# Patient Record
Sex: Male | Born: 1950 | Race: White | Hispanic: No | Smoking: Former smoker
Health system: Southern US, Community
[De-identification: ages and names within clinical notes are randomized; demographics above are authoritative.]

## PROBLEM LIST (undated history)

## (undated) DIAGNOSIS — I255 Ischemic cardiomyopathy: Secondary | ICD-10-CM

## (undated) DIAGNOSIS — J449 Chronic obstructive pulmonary disease, unspecified: Secondary | ICD-10-CM

## (undated) DIAGNOSIS — I5189 Other ill-defined heart diseases: Secondary | ICD-10-CM

## (undated) DIAGNOSIS — I10 Essential (primary) hypertension: Secondary | ICD-10-CM

## (undated) HISTORY — DX: Other ill-defined heart diseases: I51.89

## (undated) HISTORY — DX: Essential (primary) hypertension: I10

## (undated) HISTORY — PX: NOSE SURGERY: SHX723

## (undated) HISTORY — DX: Chronic obstructive pulmonary disease, unspecified: J44.9

## (undated) HISTORY — DX: Ischemic cardiomyopathy: I25.5

---

## 2020-07-07 ENCOUNTER — Other Ambulatory Visit: Payer: Self-pay

## 2020-07-07 ENCOUNTER — Ambulatory Visit (INDEPENDENT_AMBULATORY_CARE_PROVIDER_SITE_OTHER): Payer: Medicare Other

## 2020-07-07 ENCOUNTER — Ambulatory Visit
Admission: RE | Admit: 2020-07-07 | Discharge: 2020-07-07 | Disposition: A | Discharge status: Home / Self Care | Payer: Medicare Other | Source: Ambulatory Visit | Attending: Family Medicine | Admitting: Family Medicine

## 2020-07-07 VITALS — BP 164/98 | HR 96 | Temp 98.1°F | Resp 18

## 2020-07-07 DIAGNOSIS — F17200 Nicotine dependence, unspecified, uncomplicated: Secondary | ICD-10-CM | POA: Diagnosis present

## 2020-07-07 DIAGNOSIS — R0602 Shortness of breath: Secondary | ICD-10-CM

## 2020-07-07 DIAGNOSIS — I517 Cardiomegaly: Secondary | ICD-10-CM

## 2020-07-07 DIAGNOSIS — I10 Essential (primary) hypertension: Secondary | ICD-10-CM | POA: Diagnosis present

## 2020-07-07 DIAGNOSIS — M549 Dorsalgia, unspecified: Secondary | ICD-10-CM

## 2020-07-07 LAB — POCT URINALYSIS DIP (MANUAL ENTRY)
Bilirubin, UA: NEGATIVE
Blood, UA: NEGATIVE
Glucose, UA: NEGATIVE mg/dL
Ketones, POC UA: NEGATIVE mg/dL
Leukocytes, UA: NEGATIVE
Nitrite, UA: NEGATIVE
Protein Ur, POC: 30 mg/dL — AB
Spec Grav, UA: >=1.03 — AB (ref 1.010–1.025)
Urobilinogen, UA: 0.2 E.U./dL
pH, UA: 6 (ref 5.0–8.0)

## 2020-07-07 MED ORDER — CARVEDILOL 6.25 MG PO TABS: 6.2500 mg | ORAL_TABLET | Freq: Two times a day (BID) | ORAL | 0 refills | Status: DC

## 2020-07-07 NOTE — Discharge Instructions (Addendum)
You have been referred to heart care cardiology in [facility] for further work-up and evaluation of your symptoms today.  I am starting on carvedilol 6.25 mg twice daily to decrease your blood pressure which will help your heart pump more efficiently.  I have also prescribed you an albuterol inhaler to help with shortness of breath which is likely related to chronic bronchitis and uncontrolled blood pressure.  If you develop any worsening of symptoms prior to your visit with the cardiologist go immediately to the nearest emergency department. On your chest x-ray today, it was noted the presence of a right lung nodule this will require further evaluation and work-up.  You need to get established with a primary care doctor for further work-up and diagnostic testing of this finding.

## 2020-07-07 NOTE — ED Triage Notes (Signed)
Patient c/o SOB and fatigue x 2.5 months ago.   Patient endorses pain in both lower limbs upon exertion.   Patient endorses increase Sob upon exertion.   Patient endorses " I felt like I was drowning at night due to congestion, I started taking mucinex and it's helped".  Patient denies any previous health history.   Patient has smoked since age 70, patient smokes a 1/2 pack a day.   Patient denies taking any medications at this time.

## 2020-07-07 NOTE — ED Provider Notes (Signed)
[facility]    CSN: [id number] Arrival date & time: 07/07/20  1142      History   Chief Complaint No chief complaint on file.   HPI Luis Madden is a 70 y.o. male.   HPI Patient presents for evaluation worsening shortness of breath, bilateral leg pain, and back pain. Patient is a daily smoker. He has been without routine medical care and report no known chronic conditions.  He endorses recent weight gain in abdominal region over the last 5 months. Shortness of breath has gradually worsened to include at rest.  Blood pressures elevated x2 readings. He denies any chest pain.  He has a chronic cough which he reports is been present for several years.  Currently does not take any medications.  No past medical history on file.  There are no problems to display for this patient. =   Home Medications    Prior to Admission medications   Not on File    Family History No family history on file.  Social History     Allergies   Patient has no allergy information on record.   Review of Systems Review of Systems Pertinent negatives listed in HPI Physical Exam Triage Vital Signs ED Triage Vitals [07/07/20 1158]  Enc Vitals Group     BP (!) 164/74     Pulse Rate 96     Resp 18     Temp 98.1 F (36.7 C)     Temp Source Oral     SpO2 96 %     Weight      Height      Head Circumference      Peak Flow      Pain Score      Pain Loc      Pain Edu?      Excl. in GC?    No data found.  Updated Vital Signs BP (!) 164/74 (BP Location: Left Arm)   Pulse 96   Temp 98.1 F (36.7 C) (Oral)   Resp 18   SpO2 96%   Visual Acuity Right Eye Distance:   Left Eye Distance:   Bilateral Distance:    Right Eye Near:   Left Eye Near:    Bilateral Near:     Physical Exam Constitutional:      General: He is not in acute distress.    Appearance: He is not toxic-appearing.  Cardiovascular:     Rate and Rhythm: Normal rate.     Heart sounds: Murmur heard.    Diastolic murmur is present with a grade of 2/4.     Comments: Abdomen distention mildly noted during exam  Pulmonary:     Breath sounds: Examination of the right-upper field reveals rhonchi. Examination of the left-upper field reveals rhonchi. Decreased breath sounds and rhonchi present.  Musculoskeletal:        General: Normal range of motion.     Right lower leg: No edema.     Left lower leg: No edema.  Skin:    General: Skin is warm.     Capillary Refill: Capillary refill takes less than 2 seconds.  Neurological:     General: No focal deficit present.     Mental Status: He is alert and oriented to person, place, and time.     Cranial Nerves: No cranial nerve deficit.  Psychiatric:        Mood and Affect: Mood is anxious.      UC Treatments / Results  Labs (  all labs ordered are listed, but only abnormal results are displayed) Labs Reviewed - No data to display  EKG Rate 77, LVH with left axis deviation repolarization abnormality, negative for ischemic changes   Radiology No results found.  Procedures Procedures (including critical care time)  Medications Ordered in UC Medications - No data to display  Initial Impression / Assessment and Plan / UC Course  I have reviewed the triage vital signs and the nursing notes.  Pertinent labs & imaging results that were available during my care of the patient were reviewed by me and considered in my medical decision making (see chart for details).     Patient presents today with several months of worsening shortness of breath. Hypertension x multiple measurement on arrival. Audible murmur ICS # 3 AorticValve present on exam. No healthcare evaluation in more than 7 years. Concern for untreated hypertension progression to heart failure based on exam findings here in clinic. ECG LVH with repolarization abnormality, emergent referral placed to follow-up with cardiology. Chest x-ray significant for 6 mm right lung nodule possible  granuloma discussed with patient and his daughter the need to establish with a PCP.  Final Clinical Impressions(s) / UC Diagnoses   Final diagnoses:  Shortness of breath  Elevated blood pressure reading in office with diagnosis of hypertension  Current smoker  LVH (left ventricular hypertrophy), ecg     Discharge Instructions     You have been referred to heart care cardiology in [facility] for further work-up and evaluation of your symptoms today.  I am starting on carvedilol 6.25 mg twice daily to decrease your blood pressure which will help your heart pump more efficiently.  I have also prescribed you an albuterol inhaler to help with shortness of breath which is likely related to chronic bronchitis and uncontrolled blood pressure.  If you develop any worsening of symptoms prior to your visit with the cardiologist go immediately to the nearest emergency department. On your chest x-ray today, it was noted the presence of a right lung nodule this will require further evaluation and work-up.  You need to get established with a primary care doctor for further work-up and diagnostic testing of this finding.    ED Prescriptions    Medication Sig Dispense Auth. Provider   carvedilol (COREG) 6.25 MG tablet Take 1 tablet (6.25 mg total) by mouth 2 (two) times daily with a meal. 30 tablet Bing Neighbors, FNP     PDMP not reviewed this encounter.   Bing Neighbors, FNP 07/07/20 [phone]

## 2020-07-08 LAB — BASIC METABOLIC PANEL
BUN/Creatinine Ratio: 19 (ref 10–24)
BUN: 22 mg/dL (ref 8–27)
CO2: 16 mmol/L — ABNORMAL LOW (ref 20–29)
Calcium: 9.7 mg/dL (ref 8.6–10.2)
Chloride: 103 mmol/L (ref 96–106)
Creatinine, Ser: 1.18 mg/dL (ref 0.76–1.27)
Glucose: 98 mg/dL (ref 65–99)
Potassium: 4.9 mmol/L (ref 3.5–5.2)
Sodium: 139 mmol/L (ref 134–144)
eGFR: 66 mL/min/{1.73_m2} (ref >59)

## 2020-07-08 LAB — CBC
Hematocrit: 47.1 % (ref 37.5–51.0)
Hemoglobin: 16 g/dL (ref 13.0–17.7)
MCH: 32.8 pg (ref 26.6–33.0)
MCHC: 34 g/dL (ref 31.5–35.7)
MCV: 97 fL (ref 79–97)
Platelets: 211 10*3/uL (ref 150–450)
RBC: 4.88 x10E6/uL (ref 4.14–5.80)
RDW: 13.1 % (ref 11.6–15.4)
WBC: 9.5 10*3/uL (ref 3.4–10.8)

## 2020-07-13 ENCOUNTER — Encounter: Payer: Self-pay | Admitting: Cardiology

## 2020-07-13 ENCOUNTER — Ambulatory Visit (INDEPENDENT_AMBULATORY_CARE_PROVIDER_SITE_OTHER): Payer: Medicare Other | Admitting: Cardiology

## 2020-07-13 ENCOUNTER — Other Ambulatory Visit: Payer: Self-pay

## 2020-07-13 VITALS — BP 178/88 | HR 66 | Ht 71.5 in | Wt 213.0 lb

## 2020-07-13 DIAGNOSIS — I10 Essential (primary) hypertension: Secondary | ICD-10-CM

## 2020-07-13 DIAGNOSIS — I209 Angina pectoris, unspecified: Secondary | ICD-10-CM | POA: Diagnosis not present

## 2020-07-13 DIAGNOSIS — R072 Precordial pain: Secondary | ICD-10-CM | POA: Diagnosis not present

## 2020-07-13 DIAGNOSIS — R0600 Dyspnea, unspecified: Secondary | ICD-10-CM

## 2020-07-13 DIAGNOSIS — R0609 Other forms of dyspnea: Secondary | ICD-10-CM

## 2020-07-13 MED ORDER — METOPROLOL TARTRATE 100 MG PO TABS: 100.0000 mg | ORAL_TABLET | Freq: Once | ORAL | 0 refills | Status: DC

## 2020-07-13 MED ORDER — IRBESARTAN 75 MG PO TABS: 75.0000 mg | ORAL_TABLET | Freq: Every day | ORAL | 3 refills | Status: DC

## 2020-07-13 NOTE — Patient Instructions (Signed)
Medication Instructions:  Your physician has recommended you make the following change in your medication:   1.  START taking Irbesartan 75 MG once daily.  *If you need a refill on your cardiac medications before your next appointment, please call your pharmacy*   Lab Work:  BMP, Lipid panel drawn in office today  If you have labs (blood work) drawn today and your tests are completely normal, you will receive your results only by: Marland Kitchen MyChart Message (if you have MyChart) OR . A paper copy in the mail If you have any lab test that is abnormal or we need to change your treatment, we will call you to review the results.   Testing/Procedures:  1.  Your physician has requested that you have an echocardiogram. Echocardiography is a painless test that uses sound waves to create images of your heart. It provides your doctor with information about the size and shape of your heart and how well your heart's chambers and valves are working. This procedure takes approximately one hour. There are no restrictions for this procedure.  2.  Your physician has requested that you have coronary cardiac CT. Cardiac computed tomography (CT) is a painless test that uses an x-ray machine to take clear, detailed pictures of your heart.  Your cardiac CT will be scheduled at:   [facility] [address] [place], [place] [address] [phone]   Please arrive 15 mins early for check-in and test prep.   Please follow these instructions carefully (unless otherwise directed):    On the Night Before the Test: . Be sure to Drink plenty of water. . Do not consume any caffeinated/decaffeinated beverages or chocolate 12 hours prior to your test. . Do not take any antihistamines 12 hours prior to your test.    On the Day of the Test: . Drink plenty of water until 1 hour prior to the test. . Do not eat any food 4 hours prior to the test. . You may take your  regular medications prior to the test.  . Take metoprolol (Lopressor) two hours prior to test. (this was sent to your pharmacy)        After the Test: . Drink plenty of water. . After receiving IV contrast, you may experience a mild flushed feeling. This is normal. . On occasion, you may experience a mild rash up to 24 hours after the test. This is not dangerous. If this occurs, you can take Benadryl 25 mg and increase your fluid intake. . If you experience trouble breathing, this can be serious. If it is severe call 911 IMMEDIATELY. If it is mild, please call our office.    Once we have confirmed authorization from your insurance company, we will call you to set up a date and time for your test. Based on how quickly your insurance processes prior authorizations requests, please allow up to 4 weeks to be contacted for scheduling your Cardiac CT appointment. Be advised that routine Cardiac CT appointments could be scheduled as many as 8 weeks after your provider has ordered it.  For non-scheduling related questions, please contact the cardiac imaging nurse navigator should you have any questions/concerns: Rockwell Alexandria, Cardiac Imaging Nurse Navigator Larey Brick, Cardiac Imaging Nurse Navigator [facility] Direct Office Dial: [phone]   For scheduling needs, including cancellations and rescheduling, please call [place], [phone].    Follow-Up: At [facility], you and your health needs are our priority.  As  part of our continuing mission to provide you with exceptional heart care, we have created designated Provider Care Teams.  These Care Teams include your primary Cardiologist (physician) and Advanced Practice Providers (APPs -  Physician Assistants and Nurse Practitioners) who all work together to provide you with the care you need, when you need it.  We recommend signing up for the patient portal called "MyChart".  Sign up information is  provided on this After Visit Summary.  MyChart is used to connect with patients for Virtual Visits (Telemedicine).  Patients are able to view lab/test results, encounter notes, upcoming appointments, etc.  Non-urgent messages can be sent to your provider as well.   To learn more about what you can do with MyChart, go to [email or web address].    Your next appointment:   6 week(s)  The format for your next appointment:   In Person  Provider:   Debbe Odea, MD   Other Instructions

## 2020-07-13 NOTE — Progress Notes (Addendum)
Cardiology Office Note:    Date:  07/13/2020   ID:  Luis Madden, DOB [birth year], MRN [id number]  PCP:  [facility]  Cardiologist:  No primary care provider on file.  Advanced Practice Provider:  No care team member to display Electrophysiologist:  None       Referring MD: Luis Neighbors, FNP   Chief Complaint  Patient presents with  . New Patient (Initial Visit)    Referred by Luis Madden for SOB - chest pain. Meds reviewed verbally with patient.     History of Present Illness:    Luis Madden is a 70 y.o. male with a hx of hypertension, [name] x50+ years who presents due to chest pain and shortness of breath.  Patient has symptoms of shortness of breath with exertion ongoing for the past year and a half.  Symptoms seem to have worsened.  He thought this was initially due to the Covid vaccine which he will obtain back in June 2020.  He is visiting family in the area, currently lives in [place].  Has not seen a physician for couple of years now.  Due to worsening symptoms, he presented to an urgent care, he was diagnosed with hypertension, started on Coreg 6.25 mg twice daily.  He recently quit smoking within the past week.  Denies chest pain, palpitations, endorses orthopnea.   Past Medical History:  Diagnosis Date  . Hypertension     History reviewed. No pertinent surgical history.  Current Medications: Current Meds  Medication Sig  . carvedilol (COREG) 6.25 MG tablet Take 1 tablet (6.25 mg total) by mouth 2 (two) times daily with a meal.  . irbesartan (AVAPRO) 75 MG tablet Take 1 tablet (75 mg total) by mouth daily.  . metoprolol tartrate (LOPRESSOR) 100 MG tablet Take 1 tablet (100 mg total) by mouth once for 1 dose. Take 2 hours prior to your CT scan.     Allergies:   Patient has no allergy information on record.   Social History   Socioeconomic History  . Marital status: Single    Spouse name: Not on file  .  Number of children: Not on file  . Years of education: Not on file  . Highest education level: Not on file  Occupational History  . Not on file  Tobacco Use  . Smoking status: [name]    Packs/day: 0.50    Years: 57.00    Pack years: 28.50    Types: Cigarettes  . Smokeless tobacco: Never Used  . Tobacco comment: Stopped smoking Thursday - 07/08/2020  Substance and Sexual Activity  . Alcohol use: Yes    Comment: " 3-4 beers a month"  . Drug use: Never  . Sexual activity: Not on file  Other Topics Concern  . Not on file  Social History Narrative  . Not on file   Social Determinants of Health   Financial Resource Strain: Not on file  Food Insecurity: Not on file  Transportation Needs: Not on file  Physical Activity: Not on file  Stress: Not on file  Social Connections: Not on file     Family History: The patient's family history is not on file.  ROS:   Please see the history of present illness.     All other systems reviewed and are negative.  EKGs/Labs/Other Studies Reviewed:    The following studies were reviewed today:   EKG:  EKG is  ordered today.  The ekg  ordered today demonstrates normal sinus rhythm, LVH by voltage criteria.  Recent Labs: 07/07/2020: BUN 22; Creatinine, Ser 1.18; Hemoglobin 16.0; Platelets 211; Potassium 4.9; Sodium 139  Recent Lipid Panel No results found for: CHOL, TRIG, HDL, CHOLHDL, VLDL, LDLCALC, LDLDIRECT   Risk Assessment/Calculations:      Physical Exam:    VS:  BP (!) 178/88 (BP Location: Left Arm, Patient Position: Sitting, Cuff Size: Normal)   Pulse 66   Ht 5' 11.5" (1.816 m)   Wt 213 lb (96.6 kg)   SpO2 97%   BMI 29.29 kg/m     Wt Readings from Last 3 Encounters:  07/13/20 213 lb (96.6 kg)     GEN:  Well nourished, well developed in no acute distress HEENT: Normal NECK: No JVD; No carotid bruits LYMPHATICS: No lymphadenopathy CARDIAC: RRR, faint systolic murmur, RESPIRATORY:  Clear to auscultation  without rales, wheezing or rhonchi  ABDOMEN: Soft, non-tender, non-distended MUSCULOSKELETAL:  No edema; No deformity  SKIN: Warm and dry NEUROLOGIC:  Alert and oriented x 3 PSYCHIATRIC:  Normal affect   ASSESSMENT:    1. Dyspnea on exertion   2. Primary hypertension   3. Precordial pain   4. Anginal equivalent (HCC)    PLAN:    In order of problems listed above:  1. Dyspnea on exertion, risk factors of hypertension, [name].  This could be an anginal equivalent.  Also nonspecific chest discomfort. obtain echocardiogram, coronary CTA to evaluate presence of CAD.  If cardiac work-up is normal, recommend pulmonary eval for possible COPD due to long history of smoking.  Obtain fasting lipid profile. 2. Hypertension, BP still elevated.  Start irbesartan 75 mg daily, continue Coreg as prescribed.  Follow-up after echo and coronary CTA.  We will consider Lexiscan Myoview if coronary CTA is not approved.   Shared Decision Making/Informed Consent The risks [chest pain, shortness of breath, cardiac arrhythmias, dizziness, blood pressure fluctuations, myocardial infarction, stroke/transient ischemic attack, nausea, vomiting, allergic reaction, radiation exposure, metallic taste sensation and life-threatening complications (estimated to be 1 in 10,000)], benefits (risk stratification, diagnosing coronary artery disease, treatment guidance) and alternatives of a nuclear stress test were discussed in detail with Luis Madden and he agrees to proceed.      Medication Adjustments/Labs and Tests Ordered: Current medicines are reviewed at length with the patient today.  Concerns regarding medicines are outlined above.  Orders Placed This Encounter  Procedures  . Basic metabolic panel  . Lipid panel  . EKG 12-Lead  . ECHOCARDIOGRAM COMPLETE   Meds ordered this encounter  Medications  . irbesartan (AVAPRO) 75 MG tablet    Sig: Take 1 tablet (75 mg total) by mouth daily.    Dispense:   30 tablet    Refill:  3  . metoprolol tartrate (LOPRESSOR) 100 MG tablet    Sig: Take 1 tablet (100 mg total) by mouth once for 1 dose. Take 2 hours prior to your CT scan.    Dispense:  1 tablet    Refill:  0    Patient Instructions  Medication Instructions:  Your physician has recommended you make the following change in your medication:   1.  START taking Irbesartan 75 MG once daily.  *If you need a refill on your cardiac medications before your next appointment, please call your pharmacy*   Lab Work:  BMP, Lipid panel drawn in office today  If you have labs (blood work) drawn today and your tests are completely normal, you will receive your results  only by: Marland Kitchen MyChart Message (if you have MyChart) OR . A paper copy in the mail If you have any lab test that is abnormal or we need to change your treatment, we will call you to review the results.   Testing/Procedures:  1.  Your physician has requested that you have an echocardiogram. Echocardiography is a painless test that uses sound waves to create images of your heart. It provides your doctor with information about the size and shape of your heart and how well your heart's chambers and valves are working. This procedure takes approximately one hour. There are no restrictions for this procedure.  2.  Your physician has requested that you have coronary cardiac CT. Cardiac computed tomography (CT) is a painless test that uses an x-ray machine to take clear, detailed pictures of your heart.  Your cardiac CT will be scheduled at:   [facility] [address] [place], [place] [address] [phone]   Please arrive 15 mins early for check-in and test prep.   Please follow these instructions carefully (unless otherwise directed):    On the Night Before the Test: . Be sure to Drink plenty of water. . Do not consume any caffeinated/decaffeinated beverages or chocolate 12 hours  prior to your test. . Do not take any antihistamines 12 hours prior to your test.    On the Day of the Test: . Drink plenty of water until 1 hour prior to the test. . Do not eat any food 4 hours prior to the test. . You may take your regular medications prior to the test.  . Take metoprolol (Lopressor) two hours prior to test. (this was sent to your pharmacy)        After the Test: . Drink plenty of water. . After receiving IV contrast, you may experience a mild flushed feeling. This is normal. . On occasion, you may experience a mild rash up to 24 hours after the test. This is not dangerous. If this occurs, you can take Benadryl 25 mg and increase your fluid intake. . If you experience trouble breathing, this can be serious. If it is severe call 911 IMMEDIATELY. If it is mild, please call our office.    Once we have confirmed authorization from your insurance company, we will call you to set up a date and time for your test. Based on how quickly your insurance processes prior authorizations requests, please allow up to 4 weeks to be contacted for scheduling your Cardiac CT appointment. Be advised that routine Cardiac CT appointments could be scheduled as many as 8 weeks after your provider has ordered it.  For non-scheduling related questions, please contact the cardiac imaging nurse navigator should you have any questions/concerns: Rockwell Alexandria, Cardiac Imaging Nurse Navigator Larey Brick, Cardiac Imaging Nurse Navigator [facility] Direct Office Dial: [phone]   For scheduling needs, including cancellations and rescheduling, please call [place], [phone].    Follow-Up: At [facility], you and your health needs are our priority.  As part of our continuing mission to provide you with exceptional heart care, we have created designated Provider Care Teams.  These Care Teams include your primary Cardiologist (physician) and Advanced Practice  Providers (APPs -  Physician Assistants and Nurse Practitioners) who all work together to provide you with the care you need, when you need it.  We recommend signing up for the patient portal called "MyChart".  Sign up information is provided on this  After Visit Summary.  MyChart is used to connect with patients for Virtual Visits (Telemedicine).  Patients are able to view lab/test results, encounter notes, upcoming appointments, etc.  Non-urgent messages can be sent to your provider as well.   To learn more about what you can do with MyChart, go to [email or web address].    Your next appointment:   6 week(s)  The format for your next appointment:   In Person  Provider:   Debbe Odea, MD   Other Instructions      Signed, Debbe Odea, MD  07/13/2020 10:42 AM    [facility]

## 2020-07-14 LAB — LIPID PANEL
Chol/HDL Ratio: 8 ratio — ABNORMAL HIGH (ref 0.0–5.0)
Cholesterol, Total: 199 mg/dL (ref 100–199)
HDL: 25 mg/dL — ABNORMAL LOW (ref >39)
LDL Chol Calc (NIH): 149 mg/dL — ABNORMAL HIGH (ref 0–99)
Triglycerides: 134 mg/dL (ref 0–149)
VLDL Cholesterol Cal: 25 mg/dL (ref 5–40)

## 2020-07-14 LAB — BASIC METABOLIC PANEL
BUN/Creatinine Ratio: 14 (ref 10–24)
BUN: 18 mg/dL (ref 8–27)
CO2: 21 mmol/L (ref 20–29)
Calcium: 9.7 mg/dL (ref 8.6–10.2)
Chloride: 105 mmol/L (ref 96–106)
Creatinine, Ser: 1.3 mg/dL — ABNORMAL HIGH (ref 0.76–1.27)
Glucose: 110 mg/dL — ABNORMAL HIGH (ref 65–99)
Potassium: 4.8 mmol/L (ref 3.5–5.2)
Sodium: 141 mmol/L (ref 134–144)
eGFR: 59 mL/min/{1.73_m2} — ABNORMAL LOW (ref >59)

## 2020-07-15 ENCOUNTER — Telehealth: Payer: Self-pay

## 2020-07-15 ENCOUNTER — Other Ambulatory Visit: Payer: Self-pay

## 2020-07-15 DIAGNOSIS — I209 Angina pectoris, unspecified: Secondary | ICD-10-CM

## 2020-07-15 NOTE — Telephone Encounter (Signed)
Spoke with patient and informed him of the result note. Patient verbalized understanding and agreed with plan.

## 2020-07-15 NOTE — Telephone Encounter (Signed)
Called patient and left a VM requesting a call back so I can give him the following lab result note:  Debbe Odea, MD  Gibson Ramp, RN Okay for coronary CTA. Will recommend hydration/IV fluids after study.   Also sent a message to Micael Hampshire at [facility] with the IV fluid request as instructed by Dr. Azucena Cecil.

## 2020-07-15 NOTE — Telephone Encounter (Signed)
Patient is returning your call.  

## 2020-07-22 ENCOUNTER — Other Ambulatory Visit: Payer: Self-pay

## 2020-07-22 DIAGNOSIS — I209 Angina pectoris, unspecified: Secondary | ICD-10-CM

## 2020-07-22 DIAGNOSIS — R072 Precordial pain: Secondary | ICD-10-CM

## 2020-07-27 ENCOUNTER — Telehealth (HOSPITAL_COMMUNITY): Payer: Self-pay | Admitting: *Deleted

## 2020-07-27 NOTE — Telephone Encounter (Signed)
Reaching out to patient to offer assistance regarding upcoming cardiac imaging study; pt verbalizes understanding of appt date/time, parking situation and where to check in, pre-test NPO status and medications ordered, and verified current allergies; name and call back number provided for further questions should they arise  Jameelah Watts RN Navigator Cardiac Imaging [facility] [phone] office [phone] cell  Pt to take 100mg metoprolol tartrate 2 hours prior to cardiac CT scan. 

## 2020-07-29 ENCOUNTER — Ambulatory Visit
Admission: RE | Admit: 2020-07-29 | Discharge: 2020-07-29 | Disposition: A | Discharge status: Home / Self Care | Payer: Medicare Other | Source: Ambulatory Visit | Attending: Cardiology | Admitting: Cardiology

## 2020-07-29 ENCOUNTER — Other Ambulatory Visit: Payer: Self-pay

## 2020-07-29 ENCOUNTER — Other Ambulatory Visit: Payer: Self-pay | Admitting: Cardiology

## 2020-07-29 DIAGNOSIS — R072 Precordial pain: Secondary | ICD-10-CM | POA: Diagnosis present

## 2020-07-29 DIAGNOSIS — I25119 Atherosclerotic heart disease of native coronary artery with unspecified angina pectoris: Secondary | ICD-10-CM | POA: Diagnosis not present

## 2020-07-29 DIAGNOSIS — I209 Angina pectoris, unspecified: Secondary | ICD-10-CM | POA: Insufficient documentation

## 2020-07-29 DIAGNOSIS — R931 Abnormal findings on diagnostic imaging of heart and coronary circulation: Secondary | ICD-10-CM

## 2020-07-29 MED ORDER — IOHEXOL 350 MG/ML SOLN
90.0000 mL | Freq: Once | INTRAVENOUS | Status: AC | PRN
  Administered 2020-07-29: 90 mL via INTRAVENOUS

## 2020-07-29 MED ORDER — NITROGLYCERIN 0.4 MG SL SUBL
0.8000 mg | SUBLINGUAL_TABLET | Freq: Once | SUBLINGUAL | Status: AC
  Administered 2020-07-29: 0.8 mg via SUBLINGUAL

## 2020-07-29 NOTE — Progress Notes (Signed)
Patient tolerated procedure well. Ambulate w/o difficulty. Sitting in chair drinking water and coffee provided. Encouraged to drink extra water today and reasoning explained. Verbalized understanding. All questions answered. ABC intact. No further needs. Discharge from procedure area w/o issues.  

## 2020-07-30 ENCOUNTER — Telehealth: Payer: Self-pay

## 2020-07-30 NOTE — Telephone Encounter (Signed)
Called patient and gave him his CTA result note. Scheduled patient to come in on 08/05/20. He was very grateful for the call.

## 2020-07-30 NOTE — Telephone Encounter (Signed)
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  Please have patient follow-up with myself for an earlier appointment if available.  We will need to discuss viability study (cardiac MRI) prior to possible left heart cath.  Thank you

## 2020-08-05 ENCOUNTER — Ambulatory Visit (INDEPENDENT_AMBULATORY_CARE_PROVIDER_SITE_OTHER): Payer: Medicare Other | Admitting: Cardiology

## 2020-08-05 ENCOUNTER — Telehealth: Payer: Self-pay | Admitting: Cardiology

## 2020-08-05 ENCOUNTER — Encounter: Payer: Self-pay | Admitting: Cardiology

## 2020-08-05 ENCOUNTER — Other Ambulatory Visit: Payer: Self-pay

## 2020-08-05 VITALS — BP 170/62 | HR 61 | Ht 71.0 in | Wt 217.0 lb

## 2020-08-05 DIAGNOSIS — I10 Essential (primary) hypertension: Secondary | ICD-10-CM | POA: Diagnosis not present

## 2020-08-05 DIAGNOSIS — R0600 Dyspnea, unspecified: Secondary | ICD-10-CM

## 2020-08-05 DIAGNOSIS — I739 Peripheral vascular disease, unspecified: Secondary | ICD-10-CM

## 2020-08-05 DIAGNOSIS — I2510 Atherosclerotic heart disease of native coronary artery without angina pectoris: Secondary | ICD-10-CM

## 2020-08-05 DIAGNOSIS — J439 Emphysema, unspecified: Secondary | ICD-10-CM

## 2020-08-05 DIAGNOSIS — R0609 Other forms of dyspnea: Secondary | ICD-10-CM

## 2020-08-05 MED ORDER — ATORVASTATIN CALCIUM 40 MG PO TABS: 40.0000 mg | ORAL_TABLET | Freq: Every day | ORAL | 5 refills | Status: DC

## 2020-08-05 MED ORDER — IRBESARTAN 150 MG PO TABS: 150.0000 mg | ORAL_TABLET | Freq: Every day | ORAL | 5 refills | Status: DC

## 2020-08-05 MED ORDER — ASPIRIN EC 81 MG PO TBEC: 81.0000 mg | DELAYED_RELEASE_TABLET | Freq: Every day | ORAL | 3 refills | Status: AC

## 2020-08-05 MED ORDER — CARVEDILOL 6.25 MG PO TABS: 6.2500 mg | ORAL_TABLET | Freq: Two times a day (BID) | ORAL | 0 refills | Status: DC

## 2020-08-05 MED ORDER — CILOSTAZOL 100 MG PO TABS: 100.0000 mg | ORAL_TABLET | Freq: Two times a day (BID) | ORAL | 5 refills | Status: DC

## 2020-08-05 NOTE — Patient Instructions (Addendum)
Medication Instructions:   1.  INCREASE Irbesartan to 150 MG once a day.  2.  START taking Cilostazol 100 MG twice a day.  3.  START taking Lipitor 40 MG once a day.  4.  START taking Aspirin 81 MG once a day.   *If you need a refill on your cardiac medications before your next appointment, please call your pharmacy*   Lab Work:  Your physician recommends that you return for lab at your earliest convenience:  Hemaglobin and Hematocrit  - Please go to the [facility]. You will check in at the front desk to the right as you walk into the atrium. Valet Parking is offered if needed. - No appointment needed. You may go any day between 7 am and 6 pm.    Testing/Procedures:  1.  Your physician has requested that you have a lower extremity arterial duplex. This test is an ultrasound of the arteries in the legs or arms. It looks at arterial blood flow in the legs and arms. Allow one hour for Lower and Upper Arterial scans. There are no restrictions or special instructions  2.  You have been ordered a Cardiac MRI on______please arrive at the [facility] main entrance of [facility] at ________(30-45 minutes prior to test start time). ?  [facility]  [address]  [place], [place] [address]  [phone]  Proceed to the [facility] Radiology Department (First Floor).  ?  Magnetic resonance imaging (MRI) is a painless test that produces images of the inside of the body without using X-rays. During an MRI, strong magnets and radio waves work together in a Data processing manager to form detailed images. MRI images may provide more details about a medical condition than X-rays, CT scans, and ultrasounds can provide.  You may be given earphones to listen for instructions.  You may eat a light breakfast and take medications as ordered. If a contrast material will be used, an IV will be inserted into one of your veins. Contrast material will be injected into your IV.  You  will be asked to remove all metal, including: Watch, jewelry, and other metal objects including hearing aids, hair pieces and dentures. (Braces and fillings normally are not a problem.)  If contrast material was used:  It will leave your body through your urine within a day. You may be told to drink plenty of fluids to help flush the contrast material out of your system.  TEST WILL TAKE APPROXIMATELY 1 HOUR  PLEASE NOTIFY SCHEDULING AT LEAST 24 HOURS IN ADVANCE IF YOU ARE UNABLE TO KEEP YOUR APPOINTMENT.     Follow-Up: At [facility], you and your health needs are our priority.  As part of our continuing mission to provide you with exceptional heart care, we have created designated Provider Care Teams.  These Care Teams include your primary Cardiologist (physician) and Advanced Practice Providers (APPs -  Physician Assistants and Nurse Practitioners) who all work together to provide you with the care you need, when you need it.  We recommend signing up for the patient portal called "MyChart".  Sign up information is provided on this After Visit Summary.  MyChart is used to connect with patients for Virtual Visits (Telemedicine).  Patients are able to view lab/test results, encounter notes, upcoming appointments, etc.  Non-urgent messages can be sent to your provider as well.   To learn more about what you can do with MyChart, go to [email or web address].  Your next appointment:   5 week(s)  The format for your next appointment:   In Person  Provider:   Debbe Odea, MD   Other Instructions

## 2020-08-05 NOTE — Progress Notes (Addendum)
Cardiology Office Note:    Date:  08/05/2020   ID:  Luis Madden, DOB 1950/08/09, MRN [id number]  PCP:  [facility]  Cardiologist:  None  Advanced Practice Provider:  No care team member to display Electrophysiologist:  None       Referring MD: No ref. provider found   Chief Complaint  Patient presents with  . Other    Follow up post CT. Meds reviewed verbally with patient.     History of Present Illness:    Luis Madden is a 70 y.o. male with a hx of hypertension, [name] x50+ years who presents for follow-up.  Previously seen due to shortness of breath with exertion.  Echo and coronary CTA was ordered to evaluate cardiac etiology as possible cause.  Coronary CTA obtained on 07/29/2020 showed two-vessel CTO involving RCA and left circumflex.  Echocardiogram is pending.  Patient also complains of calf pains with walking, relieved with rest.  Still has shortness of breath with exertion.  CT did show emphysema.   Past Medical History:  Diagnosis Date  . Hypertension     History reviewed. No pertinent surgical history.  Current Medications: Current Meds  Medication Sig  . aspirin EC 81 MG tablet Take 1 tablet (81 mg total) by mouth daily. Swallow whole.  Marland Kitchen atorvastatin (LIPITOR) 40 MG tablet Take 1 tablet (40 mg total) by mouth daily.  . cilostazol (PLETAL) 100 MG tablet Take 1 tablet (100 mg total) by mouth 2 (two) times daily.  . [DISCONTINUED] carvedilol (COREG) 6.25 MG tablet Take 1 tablet (6.25 mg total) by mouth 2 (two) times daily with a meal.  . [DISCONTINUED] irbesartan (AVAPRO) 75 MG tablet Take 1 tablet (75 mg total) by mouth daily.     Allergies:   Patient has no known allergies.   Social History   Socioeconomic History  . Marital status: Single    Spouse name: Not on file  . Number of children: Not on file  . Years of education: Not on file  . Highest education level: Not on file  Occupational History  . Not  on file  Tobacco Use  . Smoking status: [name]    Packs/day: 0.50    Years: 57.00    Pack years: 28.50    Types: Cigarettes  . Smokeless tobacco: Never Used  . Tobacco comment: Stopped smoking Thursday - 07/08/2020  Substance and Sexual Activity  . Alcohol use: Yes    Comment: " 3-4 beers a month"  . Drug use: Never  . Sexual activity: Not on file  Other Topics Concern  . Not on file  Social History Narrative  . Not on file   Social Determinants of Health   Financial Resource Strain: Not on file  Food Insecurity: Not on file  Transportation Needs: Not on file  Physical Activity: Not on file  Stress: Not on file  Social Connections: Not on file     Family History: The patient's family history is not on file.  ROS:   Please see the history of present illness.     All other systems reviewed and are negative.  EKGs/Labs/Other Studies Reviewed:    The following studies were reviewed today:   EKG:  EKG is  ordered today.  The ekg ordered today demonstrates normal sinus rhythm, LVH by voltage criteria.  Recent Labs: 07/07/2020: Hemoglobin 16.0; Platelets 211 07/13/2020: BUN 18; Creatinine, Ser 1.30; Potassium 4.8; Sodium 141  Recent Lipid Panel  Component Value Date/Time   CHOL 199 07/13/2020 1040   TRIG 134 07/13/2020 1040   HDL 25 (L) 07/13/2020 1040   CHOLHDL 8.0 (H) 07/13/2020 1040   LDLCALC 149 (H) 07/13/2020 1040     Risk Assessment/Calculations:      Physical Exam:    VS:  BP (!) 170/62 (BP Location: Left Arm, Patient Position: Sitting, Cuff Size: Normal)   Pulse 61   Ht 5\' 11"  (1.803 m)   Wt 217 lb (98.4 kg)   SpO2 98%   BMI 30.27 kg/m     Wt Readings from Last 3 Encounters:  08/05/20 217 lb (98.4 kg)  07/13/20 213 lb (96.6 kg)     GEN:  Well nourished, well developed in no acute distress HEENT: Normal NECK: No JVD; No carotid bruits LYMPHATICS: No lymphadenopathy CARDIAC: RRR, faint systolic murmur, RESPIRATORY:  Clear to  auscultation without rales, wheezing or rhonchi  ABDOMEN: Soft, non-tender, non-distended MUSCULOSKELETAL:  No edema; No deformity  SKIN: Warm and dry NEUROLOGIC:  Alert and oriented x 3 PSYCHIATRIC:  Normal affect   ASSESSMENT:    1. Coronary artery disease involving native coronary artery of native heart, unspecified whether angina present   2. Dyspnea on exertion   3. Primary hypertension   4. Claudication (HCC)   5. Pulmonary emphysema, unspecified emphysema type (HCC)    PLAN:    In order of problems listed above:  1. Significant two-vessel CAD on coronary CTA.  CTO of RCA and mid left circumflex. LAD with non obstructive disease. Obtain cardiac MRI to evaluate for viability.  Keep appointment to obtain echocardiogram as scheduled.  Consider revascularization if LV is viable.  Start aspirin, Lipitor 40 mg daily. 2. Dyspnea on exertion, get echo, MRI as above.  Patient has emphysema on chest CT.  Will refer to pulmonary medicine.  Albuterol as needed. 3. Hypertension, BP still elevated.  Increase irbesartan to 150 mg daily, continue Coreg as prescribed. 4. Claudication, [name].  Obtain lower extremity arterial ultrasound.  Start cilostazol 100 mg twice daily.  Plan to refer to peripheral arterial clinic with Dr. Kirke CorinArida after lower extremity ultrasound and MRI results are known.  Follow-up after echo and CMR   Medication Adjustments/Labs and Tests Ordered: Current medicines are reviewed at length with the patient today.  Concerns regarding medicines are outlined above.  Orders Placed This Encounter  Procedures  . MR Card Morphology Wo/W Cm  . Hemoglobin and hematocrit, blood  . Ambulatory referral to Pulmonology  . VAS US LOWER EXTREMITY ARTERIAL DUPLEX   Meds ordered this encounter  Medications  . irbesartan (AVAPRO) 150 MG tablet    Sig: Take 1 tablet (150 mg total) by mouth daily.    Dispense:  30 tablet    Refill:  5  . carvedilol (COREG) 6.25 MG tablet    Sig:  Take 1 tablet (6.25 mg total) by mouth 2 (two) times daily with a meal.    Dispense:  180 tablet    Refill:  0  . cilostazol (PLETAL) 100 MG tablet    Sig: Take 1 tablet (100 mg total) by mouth 2 (two) times daily.    Dispense:  60 tablet    Refill:  5  . atorvastatin (LIPITOR) 40 MG tablet    Sig: Take 1 tablet (40 mg total) by mouth daily.    Dispense:  30 tablet    Refill:  5  . aspirin EC 81 MG tablet    Sig: Take 1 tablet (  81 mg total) by mouth daily. Swallow whole.    Dispense:  90 tablet    Refill:  3    Patient Instructions  Medication Instructions:   1.  INCREASE Irbesartan to 150 MG once a day.  2.  START taking Cilostazol 100 MG twice a day.  3.  START taking Lipitor 40 MG once a day.  4.  START taking Aspirin 81 MG once a day.   *If you need a refill on your cardiac medications before your next appointment, please call your pharmacy*   Lab Work:  Your physician recommends that you return for lab at your earliest convenience:  Hemaglobin and Hematocrit  - Please go to the [facility]. You will check in at the front desk to the right as you walk into the atrium. Valet Parking is offered if needed. - No appointment needed. You may go any day between 7 am and 6 pm.    Testing/Procedures:  1.  Your physician has requested that you have a lower extremity arterial duplex. This test is an ultrasound of the arteries in the legs or arms. It looks at arterial blood flow in the legs and arms. Allow one hour for Lower and Upper Arterial scans. There are no restrictions or special instructions  2.  You have been ordered a Cardiac MRI on______please arrive at the [facility] main entrance of [facility] at ________(30-45 minutes prior to test start time). ?  [facility]  [address]  [place], [place] [address]  [phone]  Proceed to the [facility] Radiology Department (First Floor).  ?  Magnetic resonance imaging (MRI) is a painless  test that produces images of the inside of the body without using X-rays. During an MRI, strong magnets and radio waves work together in a Data processing manager to form detailed images. MRI images may provide more details about a medical condition than X-rays, CT scans, and ultrasounds can provide.  You may be given earphones to listen for instructions.  You may eat a light breakfast and take medications as ordered. If a contrast material will be used, an IV will be inserted into one of your veins. Contrast material will be injected into your IV.  You will be asked to remove all metal, including: Watch, jewelry, and other metal objects including hearing aids, hair pieces and dentures. (Braces and fillings normally are not a problem.)  If contrast material was used:  It will leave your body through your urine within a day. You may be told to drink plenty of fluids to help flush the contrast material out of your system.  TEST WILL TAKE APPROXIMATELY 1 HOUR  PLEASE NOTIFY SCHEDULING AT LEAST 24 HOURS IN ADVANCE IF YOU ARE UNABLE TO KEEP YOUR APPOINTMENT.     Follow-Up: At [facility], you and your health needs are our priority.  As part of our continuing mission to provide you with exceptional heart care, we have created designated Provider Care Teams.  These Care Teams include your primary Cardiologist (physician) and Advanced Practice Providers (APPs -  Physician Assistants and Nurse Practitioners) who all work together to provide you with the care you need, when you need it.  We recommend signing up for the patient portal called "MyChart".  Sign up information is provided on this After Visit Summary.  MyChart is used to connect with patients for Virtual Visits (Telemedicine).  Patients are able to view lab/test results, encounter notes, upcoming appointments, etc.  Non-urgent  messages can be sent to your provider as well.   To learn more about what you can do with MyChart, go to [email or web address].     Your next appointment:   5 week(s)  The format for your next appointment:   In Person  Provider:   Debbe Odea, MD   Other Instructions       Signed, Debbe Odea, MD  08/05/2020 12:21 PM    [facility]

## 2020-08-05 NOTE — Telephone Encounter (Signed)
Left message for patient to call and discuss scheduling the STAT Cardiac MRI ordered by Dr. Azucena Cecil

## 2020-08-06 NOTE — Telephone Encounter (Signed)
Spoke with patient regarding the Tuesday 08/10/20 8:00 am--Cardiac MRI appointment at [facility]---arrival time is 7:30 am--1st floor admissions ---patient given address on [address] and was advised of valet parking.  Patient voiced his understanding.  Patient also has echocardiogram scheduled at 8:30 am in our [place] office on 08/10/20---I called and spoke with [place] and she stated she will call patient and reschedule.

## 2020-08-09 ENCOUNTER — Telehealth (HOSPITAL_COMMUNITY): Payer: Self-pay | Admitting: *Deleted

## 2020-08-09 NOTE — Telephone Encounter (Signed)
Reaching out to patient to offer assistance regarding upcoming cardiac imaging study; pt verbalizes understanding of appt date/time, parking situation and where to check in, and verified current allergies; name and call back number provided for further questions should they arise  Daya Dutt RN Navigator Cardiac Imaging [facility] [phone] office [phone] cell  

## 2020-08-10 ENCOUNTER — Ambulatory Visit (HOSPITAL_COMMUNITY)
Admission: RE | Admit: 2020-08-10 | Discharge: 2020-08-10 | Disposition: A | Discharge status: Home / Self Care | Payer: Medicare Other | Source: Ambulatory Visit | Attending: Cardiology | Admitting: Cardiology

## 2020-08-10 ENCOUNTER — Other Ambulatory Visit: Payer: Self-pay

## 2020-08-10 ENCOUNTER — Other Ambulatory Visit: Payer: Medicare Other

## 2020-08-10 DIAGNOSIS — I2510 Atherosclerotic heart disease of native coronary artery without angina pectoris: Secondary | ICD-10-CM | POA: Diagnosis not present

## 2020-08-10 MED ORDER — GADOBUTROL 1 MMOL/ML IV SOLN
10.0000 mL | Freq: Once | INTRAVENOUS | Status: AC | PRN
  Administered 2020-08-10: 10 mL via INTRAVENOUS

## 2020-08-11 ENCOUNTER — Ambulatory Visit (INDEPENDENT_AMBULATORY_CARE_PROVIDER_SITE_OTHER): Payer: Medicare Other

## 2020-08-11 DIAGNOSIS — R0600 Dyspnea, unspecified: Secondary | ICD-10-CM | POA: Diagnosis not present

## 2020-08-11 DIAGNOSIS — R0609 Other forms of dyspnea: Secondary | ICD-10-CM

## 2020-08-11 LAB — ECHOCARDIOGRAM COMPLETE
AR max vel: 3.14 cm2
AV Area VTI: 3.25 cm2
AV Area mean vel: 2.82 cm2
AV Mean grad: 3 mmHg
AV Peak grad: 5.4 mmHg
Ao pk vel: 1.16 m/s
Area-P 1/2: 3.03 cm2
P 1/2 time: 422 msec
S' Lateral: 5.1 cm
Single Plane A4C EF: 51 %

## 2020-08-11 MED ORDER — PERFLUTREN LIPID MICROSPHERE
1.0000 mL | INTRAVENOUS | Status: AC | PRN
  Administered 2020-08-11: 2 mL via INTRAVENOUS

## 2020-08-13 ENCOUNTER — Telehealth: Payer: Self-pay

## 2020-08-13 DIAGNOSIS — I2510 Atherosclerotic heart disease of native coronary artery without angina pectoris: Secondary | ICD-10-CM

## 2020-08-13 MED ORDER — ENTRESTO 49-51 MG PO TABS: 1.0000 | ORAL_TABLET | Freq: Two times a day (BID) | ORAL | 5 refills | Status: AC

## 2020-08-13 MED ORDER — FUROSEMIDE 40 MG PO TABS: 40.0000 mg | ORAL_TABLET | Freq: Every day | ORAL | 5 refills | Status: DC

## 2020-08-13 NOTE — Telephone Encounter (Signed)
Called patient and informed him of the recommendations from Dr. Azucena Cecil as seen below. Patient verbalized understanding and agreed with plan.  Patient was called today both of her daughters including Inetta Fermo were on the line.  MRI results was discussed in detail.  Please stop irbesartan. Start Entresto 49-51 mg twice daily. Start Lasix 40 mg daily. Check BMP in 1 week.  Keep follow-up appointment.

## 2020-08-16 ENCOUNTER — Telehealth: Payer: Self-pay

## 2020-08-16 NOTE — Telephone Encounter (Addendum)
Patient assistance application has been started.  Awaiting patient signature , How many people are in the house hold, and physician signature.  Application will be placed up front awaiting patient signature.

## 2020-08-16 NOTE — Telephone Encounter (Signed)
Spoke with patients daughter Inetta Fermo and informed her that we have a Patient Assistance Form at our front desk for patient to come in and sign. Advised that patient stay on his Irbesartan until we can hopefully get approval for assistance for his Entresto.  Inetta Fermo was grateful for the call and stated that he would be in to sign.

## 2020-08-18 NOTE — Telephone Encounter (Signed)
Pt has been approved for Patient assistance program Entresto until 04/02/2021.

## 2020-08-26 ENCOUNTER — Ambulatory Visit: Payer: Medicare Other | Admitting: Cardiology

## 2020-08-31 ENCOUNTER — Other Ambulatory Visit
Admission: RE | Admit: 2020-08-31 | Discharge: 2020-08-31 | Disposition: A | Discharge status: Home / Self Care | Payer: Medicare Other | Source: Ambulatory Visit | Attending: Cardiology | Admitting: Cardiology

## 2020-08-31 DIAGNOSIS — I2510 Atherosclerotic heart disease of native coronary artery without angina pectoris: Secondary | ICD-10-CM

## 2020-08-31 LAB — BASIC METABOLIC PANEL
Anion gap: 10 (ref 5–15)
BUN: 38 mg/dL — ABNORMAL HIGH (ref 8–23)
CO2: 23 mmol/L (ref 22–32)
Calcium: 9.4 mg/dL (ref 8.9–10.3)
Chloride: 103 mmol/L (ref 98–111)
Creatinine, Ser: 1.56 mg/dL — ABNORMAL HIGH (ref 0.61–1.24)
GFR, Estimated: 47 mL/min — ABNORMAL LOW (ref >60)
Glucose, Bld: 120 mg/dL — ABNORMAL HIGH (ref 70–99)
Potassium: 4.5 mmol/L (ref 3.5–5.1)
Sodium: 136 mmol/L (ref 135–145)

## 2020-09-06 ENCOUNTER — Telehealth: Payer: Self-pay | Admitting: *Deleted

## 2020-09-06 DIAGNOSIS — I10 Essential (primary) hypertension: Secondary | ICD-10-CM

## 2020-09-06 DIAGNOSIS — I2510 Atherosclerotic heart disease of native coronary artery without angina pectoris: Secondary | ICD-10-CM

## 2020-09-06 DIAGNOSIS — Z79899 Other long term (current) drug therapy: Secondary | ICD-10-CM

## 2020-09-06 MED ORDER — FUROSEMIDE 40 MG PO TABS: 20.0000 mg | ORAL_TABLET | Freq: Every day | ORAL | 5 refills | Status: DC

## 2020-09-06 NOTE — Telephone Encounter (Signed)
Spoke to pt. Notified of lab results and provider's recc.  Pt voiced understanding.  He will DECREASE Lasix to 20mg  daily.  Currently takes 40mg  daily and will cut pill in half.  Med list updated.  [facility]  Pt states he is out of town at this time and plans to return 6/17.  Will have labs as soon as he returns.  Lab orders placed.  Pt has no further questions at this time.

## 2020-09-06 NOTE — Telephone Encounter (Signed)
-----   Message from Debbe Odea, MD sent at 09/06/2020  9:54 AM EDT ----- Renal function slightly abnormal compared to last.  Reduce Lasix to 20 mg daily.  Repeat BMP in 1 week.  Thank you

## 2020-09-15 ENCOUNTER — Other Ambulatory Visit: Payer: Self-pay | Admitting: Cardiology

## 2020-09-15 DIAGNOSIS — I739 Peripheral vascular disease, unspecified: Secondary | ICD-10-CM

## 2020-09-16 ENCOUNTER — Ambulatory Visit: Payer: Medicare Other | Admitting: Cardiology

## 2020-09-20 ENCOUNTER — Other Ambulatory Visit
Admission: RE | Admit: 2020-09-20 | Discharge: 2020-09-20 | Disposition: A | Discharge status: Home / Self Care | Payer: Medicare Other | Attending: Cardiology | Admitting: Cardiology

## 2020-09-20 ENCOUNTER — Other Ambulatory Visit: Payer: Self-pay

## 2020-09-20 DIAGNOSIS — Z79899 Other long term (current) drug therapy: Secondary | ICD-10-CM

## 2020-09-20 DIAGNOSIS — I2510 Atherosclerotic heart disease of native coronary artery without angina pectoris: Secondary | ICD-10-CM | POA: Diagnosis present

## 2020-09-20 DIAGNOSIS — I10 Essential (primary) hypertension: Secondary | ICD-10-CM

## 2020-09-20 LAB — BASIC METABOLIC PANEL
Anion gap: 7 (ref 5–15)
BUN: 25 mg/dL — ABNORMAL HIGH (ref 8–23)
CO2: 23 mmol/L (ref 22–32)
Calcium: 9 mg/dL (ref 8.9–10.3)
Chloride: 107 mmol/L (ref 98–111)
Creatinine, Ser: 1.34 mg/dL — ABNORMAL HIGH (ref 0.61–1.24)
GFR, Estimated: 57 mL/min — ABNORMAL LOW (ref >60)
Glucose, Bld: 104 mg/dL — ABNORMAL HIGH (ref 70–99)
Potassium: 4.4 mmol/L (ref 3.5–5.1)
Sodium: 137 mmol/L (ref 135–145)

## 2020-09-27 ENCOUNTER — Ambulatory Visit: Payer: Medicare Other | Admitting: Cardiology

## 2020-10-07 ENCOUNTER — Encounter: Payer: Self-pay | Admitting: Pulmonary Disease

## 2020-10-07 ENCOUNTER — Other Ambulatory Visit: Payer: Self-pay

## 2020-10-07 ENCOUNTER — Ambulatory Visit (INDEPENDENT_AMBULATORY_CARE_PROVIDER_SITE_OTHER): Payer: Medicare Other | Admitting: Pulmonary Disease

## 2020-10-07 VITALS — BP 170/84 | HR 69 | Temp 98.0°F | Ht 72.0 in | Wt 210.8 lb

## 2020-10-07 DIAGNOSIS — I255 Ischemic cardiomyopathy: Secondary | ICD-10-CM

## 2020-10-07 DIAGNOSIS — R0602 Shortness of breath: Secondary | ICD-10-CM | POA: Diagnosis not present

## 2020-10-07 DIAGNOSIS — J449 Chronic obstructive pulmonary disease, unspecified: Secondary | ICD-10-CM | POA: Diagnosis not present

## 2020-10-07 DIAGNOSIS — I5189 Other ill-defined heart diseases: Secondary | ICD-10-CM

## 2020-10-07 DIAGNOSIS — F17210 Nicotine dependence, cigarettes, uncomplicated: Secondary | ICD-10-CM

## 2020-10-07 DIAGNOSIS — Z7689 Persons encountering health services in other specified circumstances: Secondary | ICD-10-CM

## 2020-10-07 DIAGNOSIS — I10 Essential (primary) hypertension: Secondary | ICD-10-CM

## 2020-10-07 MED ORDER — ALBUTEROL SULFATE HFA 108 (90 BASE) MCG/ACT IN AERS: 2.0000 | INHALATION_SPRAY | Freq: Four times a day (QID) | RESPIRATORY_TRACT | 2 refills | Status: AC | PRN

## 2020-10-07 MED ORDER — TRELEGY ELLIPTA 100-62.5-25 MCG/INH IN AEPB: 1.0000 | INHALATION_SPRAY | Freq: Every day | RESPIRATORY_TRACT | 0 refills | Status: AC

## 2020-10-07 NOTE — Progress Notes (Signed)
Subjective:    Patient ID: Luis Madden, male    DOB: 1950/04/14, 70 y.o.   MRN: [id number] Chief Complaint  Patient presents with   Follow-up    SOB, fatigue     HPI Patient is a 70 year old smoker (unknown amount currently) who presents for evaluation of dyspnea and fatigue.  He is kindly referred by Dr. Debbe Odea.  Patient has been residing in [place] but relocated to this area in March.  He has been residing with his daughter.  He had not been getting primary care for quite a number of years.  He was noted by his daughter to be quite short of breath after mowing the grass in the home.  He noted at that time also having some palpitations and wheezing.  The patient was also noted to have weight gain and increased abdominal girth at that time.  He presented to the emergency room on 07 July 2020 due to issues with shortness of breath and leg and back pain.  Evaluation in the emergency room show that he has significant hypertension and was noted to have a murmur.  He was placed on carvedilol and referred to cardiology and was subsequently evaluated by Dr. Debbe Odea.  The patient states that he quit smoking at that time however, after the patient left the office the patient's daughter (DPOA) endorsed that he is still smoking via message on "MyChart".  Dr. Debbe Odea ordered an echocardiogram which showed that he had an LVEF of 30%, severely decreased left ventricular function with regional wall motion abnormalities as well as grade II DD.  There is evidence of the entire inferior and inferolateral walls of the ventricle.  Left atrial size was dilated and there is mild to moderate mitral valve regurgitation.  No evidence of pulmonary hypertension.  And then underwent CT for coronary morphology evaluation as well as cardiac MRI.  MRI has shown LVEF to be approximately 25% and findings consistent with ischemic cardiomyopathy the inferior and inferolateral walls of the LV were not  viable.  On the cardiac CT images the patient has evidence of emphysema.  Chest x-ray shows a calcified granuloma which will not require further work-up.  The patient endorses that he actually went thinking about it, has been having symptoms for approximately a year and a half of increasing shortness of breath.  He has a morning cough productive of whitish sputum.  No hemoptysis.  Does not describe any orthopnea or paroxysmal nocturnal dyspnea.  Had some lower extremity edema which now has resolved.  He does note some improvement of his symptoms on the medications initiated by cardiology.  He also notes improvement of his symptoms when he is "relaxing and taking deep breaths".  He still however has some residual symptoms of dyspnea.  He still does not have a primary care physician.  The patient does not have any prior military experience.  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  No significant occupational exposure.  Review of Systems A 10 point review of systems was performed and it is as noted above otherwise negative.  Past Medical History:  Diagnosis Date   Hypertension    History reviewed. No pertinent surgical history.  History reviewed. No pertinent family history.  Social History   Tobacco Use   Smoking status: Every Day    Packs/day: 0.50    Years: 57.00  Pack years: 28.50    Types: Cigarettes   Smokeless tobacco: Never   Tobacco comments:    Stopped smoking Thursday - 07/08/2020, has relapsed    Reviewed 10/07/2020  Substance Use Topics   Alcohol use: Yes    Comment: " 3-4 beers a month"   No Known Allergies  Current Meds  Medication Sig   aspirin EC 81 MG tablet Take 1 tablet (81 mg total) by mouth daily. Swallow whole.   atorvastatin (LIPITOR) 40 MG tablet Take 1 tablet (40 mg total) by mouth daily.   carvedilol (COREG) 6.25 MG tablet Take 1 tablet (6.25 mg total)  by mouth 2 (two) times daily with a meal.   cilostazol (PLETAL) 100 MG tablet Take 1 tablet (100 mg total) by mouth 2 (two) times daily.   furosemide (LASIX) 40 MG tablet Take 0.5 tablets (20 mg total) by mouth daily.   irbesartan (AVAPRO) 150 MG tablet Take 150 mg by mouth daily.   sacubitril-valsartan (ENTRESTO) 49-51 MG Take 1 tablet by mouth 2 (two) times daily.    There is no immunization history on file for this patient.    Objective:   Physical Exam BP (!) 170/84 (BP Location: Left Arm, Patient Position: Sitting, Cuff Size: Normal)   Pulse 69   Temp 98 F (36.7 C) (Oral)   Ht 6' (1.829 m)   Wt 210 lb 12.8 oz (95.6 kg)   SpO2 97%   BMI 28.59 kg/m   GENERAL: Well-developed, well-nourished gentleman, no acute distress.  Fully ambulatory.  No conversational dyspnea. HEAD: Normocephalic, atraumatic.  EYES: Pupils equal, round, reactive to light.  No scleral icterus.  MOUTH: Nose/mouth/throat not examined due to masking requirements for COVID 19. NECK: Supple. No thyromegaly. Trachea midline. No JVD.  No adenopathy. PULMONARY: Good air entry bilaterally.  Coarse breath sounds, mild end expiratory wheeze.   CARDIOVASCULAR: S1 and S2. Regular rate and rhythm.  Grade 2/6 mitral regurgitation murmur. ABDOMEN: Benign. MUSCULOSKELETAL: No joint deformity, no edema.  There is clubbing of the fingers and toes, mild in degree. NEUROLOGIC: No focal deficit, no gait disturbance, speech is fluent. SKIN: Intact,warm,dry. PSYCH: Mood and behavior normal.      Assessment & Plan:     ICD-10-CM   1. COPD suggested by initial evaluation (HCC)  J44.9 Pulmonary Function Test [facility] Only   Will obtain PFTs Trial of Trelegy Ellipta 100/62.5/25 1 puff daily Albuterol for as needed use    2. Shortness of breath  R06.02    Suspect multifactorial Decreased LVEF Likely uncompensated COPD    3. Ischemic cardiomyopathy  I25.5    Per cardiology LVEF 25 to 30% This issue adds complexity to his  management    4. Grade II diastolic dysfunction  I51.89    This issue adds complexity to his management Likely adds to his sensation of dyspnea    5. Hypertension, essential  I10    Patient blood pressure was elevated today He has had medications recently adjusted by cardiology He will need follow-up with primary care as well     6. Tobacco dependence due to cigarettes  F17.210 Ambulatory Referral for Lung Cancer Scre   Enroll in lung cancer screening program Needs to quit smoking    7. Encounter to establish care  Z76.89 Ambulatory referral to Internal Medicine     Orders Placed This Encounter  Procedures   Ambulatory Referral for Lung Cancer Scre    Referral Priority:   Routine    Referral Type:  Consultation    Referral Reason:   Specialty Services Required    Number of Visits Requested:   1   Pulmonary Function Test [facility] Only    Standing Status:   Future    Standing Expiration Date:   10/07/2021    Scheduling Instructions:     Next available    Order Specific Question:   Full PFT: includes the following: basic spirometry, spirometry pre & post bronchodilator, diffusion capacity (DLCO), lung volumes    Answer:   Full PFT    Order Specific Question:   This test can only be performed at    Answer:   [facility]   Meds ordered this encounter  Medications   albuterol (VENTOLIN HFA) 108 (90 Base) MCG/ACT inhaler    Sig: Inhale 2 puffs into the lungs every 6 (six) hours as needed for wheezing or shortness of breath.    Dispense:  8 g    Refill:  2   Fluticasone-Umeclidin-Vilant (TRELEGY ELLIPTA) 100-62.5-25 MCG/INH AEPB    Sig: Inhale 1 puff into the lungs daily for 1 day.    Dispense:  14 each    Refill:  0    Order Specific Question:   Lot Number?    Answer:   [id number]    Order Specific Question:   Expiration Date?    Answer:   05/04/2022    Order Specific Question:   Manufacturer?    Answer:   [employer] [[id number]]    Order Specific Question:   Quantity    Answer:    2    Discussion:  The patient has issues with dyspnea that are likely related in a great part by his ischemic cardiomyopathy and decrease left ventricular ejection fraction.  Despite management of these issues however he still has a significant issue with dyspnea.  He is a longtime smoker and has evidence of centrilobular emphysema on imaging.  He does not have pulmonary function testing done.  We will order pulmonary function testing and give the patient a trial of Trelegy Ellipta 1 inhalation daily he will also be provided with as needed albuterol.  The patient was taught the proper use of the dry powder inhaler as well as proper use of metered-dose inhaler.  The patient will be enrolled in lung cancer screening program given his history of smoking.  With regards to his fatigue this is likely related to his low LVEF.  The patient does not have a primary care physician and will refer to primary care.  We will see the patient in follow-up in time he is to contact us prior to that time should any new difficulties arise.  Gailen Shelter, MD [facility]   *This note was dictated using voice recognition software/Dragon.  Despite best efforts to proofread, errors can occur which can change the meaning.  Any change was purely unintentional.

## 2020-10-07 NOTE — Telephone Encounter (Signed)
This is what he reported to [place].  Its not going to change what I am doing for him right now.  He will however continue to call because worsening of his heart and lung issues.  He needs to quit smoking.  I will adjust this note appropriately as I have not completed yet.

## 2020-10-07 NOTE — Patient Instructions (Addendum)
We will schedule breathing tests.  We are giving you a trial of Trelegy Ellipta 100/82.5/25, 1 puff daily.  Make sure you rinse your mouth well after you use it.  We are sending a albuterol inhaler to your pharmacy this is rescue inhaler meaning that you only use it as needed.  We will refer you to the lung cancer screening program.  We will refer you to primary care.  We will see you in follow-up in 4 to 6 weeks time with either me or the nurse practitioner.

## 2020-10-07 NOTE — Telephone Encounter (Signed)
Routing to Dr. Gonzalez as an FYI 

## 2020-10-08 ENCOUNTER — Other Ambulatory Visit
Admission: RE | Admit: 2020-10-08 | Discharge: 2020-10-08 | Disposition: A | Discharge status: Home / Self Care | Payer: Medicare Other | Source: Ambulatory Visit | Attending: Pulmonary Disease | Admitting: Pulmonary Disease

## 2020-10-08 ENCOUNTER — Encounter: Payer: Self-pay | Admitting: Pulmonary Disease

## 2020-10-08 DIAGNOSIS — Z20822 Contact with and (suspected) exposure to covid-19: Secondary | ICD-10-CM | POA: Insufficient documentation

## 2020-10-08 DIAGNOSIS — Z01812 Encounter for preprocedural laboratory examination: Secondary | ICD-10-CM | POA: Diagnosis present

## 2020-10-08 LAB — SARS CORONAVIRUS 2 (TAT 6-24 HRS): SARS Coronavirus 2: NEGATIVE

## 2020-10-11 ENCOUNTER — Ambulatory Visit: Payer: Medicare Other | Attending: Pulmonary Disease

## 2020-10-11 ENCOUNTER — Other Ambulatory Visit: Payer: Self-pay

## 2020-10-11 DIAGNOSIS — J449 Chronic obstructive pulmonary disease, unspecified: Secondary | ICD-10-CM | POA: Insufficient documentation

## 2020-10-11 MED ORDER — ALBUTEROL SULFATE (2.5 MG/3ML) 0.083% IN NEBU
2.5000 mg | INHALATION_SOLUTION | Freq: Once | RESPIRATORY_TRACT | Status: AC
  Administered 2020-10-11: 2.5 mg via RESPIRATORY_TRACT
  Filled 2020-10-11: qty 3

## 2020-11-05 ENCOUNTER — Other Ambulatory Visit: Payer: Self-pay

## 2020-11-05 ENCOUNTER — Encounter: Payer: Self-pay | Admitting: Pulmonary Disease

## 2020-11-05 ENCOUNTER — Ambulatory Visit (INDEPENDENT_AMBULATORY_CARE_PROVIDER_SITE_OTHER): Payer: Medicare Other | Admitting: Pulmonary Disease

## 2020-11-05 VITALS — BP 132/72 | HR 68 | Temp 98.0°F | Ht 71.0 in | Wt 211.4 lb

## 2020-11-05 DIAGNOSIS — J449 Chronic obstructive pulmonary disease, unspecified: Secondary | ICD-10-CM

## 2020-11-05 DIAGNOSIS — Z87891 Personal history of nicotine dependence: Secondary | ICD-10-CM

## 2020-11-05 DIAGNOSIS — I255 Ischemic cardiomyopathy: Secondary | ICD-10-CM

## 2020-11-05 DIAGNOSIS — I5189 Other ill-defined heart diseases: Secondary | ICD-10-CM

## 2020-11-05 DIAGNOSIS — R0602 Shortness of breath: Secondary | ICD-10-CM

## 2020-11-05 DIAGNOSIS — I10 Essential (primary) hypertension: Secondary | ICD-10-CM | POA: Insufficient documentation

## 2020-11-05 MED ORDER — TRELEGY ELLIPTA 200-62.5-25 MCG/INH IN AEPB: 1.0000 | INHALATION_SPRAY | Freq: Every day | RESPIRATORY_TRACT | 0 refills | Status: AC

## 2020-11-05 NOTE — Progress Notes (Signed)
Subjective:    Patient ID: Luis Madden, male    DOB: 06/02/1950, 70 y.o.   MRN: [id number] Chief Complaint  Patient presents with   Follow-up    Breathing has improved some since last visit. He uses his rescue inhaler about 2 x per day.     HPI Patient is a 70 year old recent former smoker (quit April, 114 PY) who presents for follow-up on dyspnea and COPD by initial impression.  This is a scheduled visit.  The patient notes that since his initial visit here on 07 October 2020 he is markedly improved with regards to his dyspnea.  Feels this is mostly related to his cardiac medications however he does note that the Trelegy Ellipta which he is now taking also helps significantly.  Even with this he is still requiring albuterol use for approximately 2 times per day.  He notes that he uses albuterol mostly because he gets a lot of "mucus".  As noted he quit smoking at the end of April some of this excessive "mucus" may be related to reestablishment of normal respiratory mucosa.  In addition he has been rinsing with Listerine at nighttime and this may be causing more issues with oral and pharyngeal irritation.  He has not had any fevers, chills or sweats.  No hemoptysis.  No chest pain, no lower extremity edema.  No calf tenderness.  Overall, he feels well and looks well.  DATA: 07/07/2020 chest x-ray: Cardiomegaly, chronic bronchitis changes, granuloma in the right midlung. 07/29/2020 cardiac CT: Coronary calcium score of 907 (93rd percentile) total occlusion of the RCA, moderate stenosis of the mid left circumflex, mild LAD stenosis 08/10/2020 cardiac MRI: Moderately dilated left ventricle, severely reduced LV function at 25%, severe hypokinesis of the inferior and inferior lateral LV walls, moderately reduced right ventricular function, RV EF 40%.  Findings are as consistent with ischemic cardiomyopathy.  Inferior and inferolateral walls of the LV not viable. 08/12/2018 two 2D echo: LVEF 30%,  decreased LV function, regional wall motion abnormalities noted, moderate asymmetric left ventricular hypertrophy in the septum, DD grade II, dilated left atrium, mild to moderate mitral regurgitation, mild aortic regurgitation. 10/11/2020 PFTs: FEV1 2.87 L or 92% predicted, FVC 3.63 L or 88% predicted, FEV1/FVC 79% moderate diffusion capacity impairment.  Consistent with mild COPD.  Review of Systems A 10 point review of systems was performed and it is as noted above otherwise negative.  Patient Active Problem List   Diagnosis Date Noted   Grade II diastolic dysfunction 11/05/2020   Hypertension 11/05/2020   Ischemic cardiomyopathy 11/05/2020   Stage 1 mild COPD by GOLD classification (HCC) 11/05/2020   Social History   Tobacco Use   Smoking status: Former    Packs/day: 2.00    Years: 57.00    Pack years: 114.00    Types: Cigarettes    Quit date: 07/02/2020    Years since quitting: 0.3   Smokeless tobacco: Former  Substance Use Topics   Alcohol use: Yes    Comment: " 3-4 beers a month"   No Known Allergies  Current Meds  Medication Sig   albuterol (VENTOLIN HFA) 108 (90 Base) MCG/ACT inhaler Inhale 2 puffs into the lungs every 6 (six) hours as needed for wheezing or shortness of breath.   aspirin EC 81 MG tablet Take 1 tablet (81 mg total) by mouth daily. Swallow whole.   atorvastatin (LIPITOR) 40 MG tablet Take 1 tablet (40 mg total) by mouth daily.   carvedilol (COREG) 6.25 MG  tablet Take 1 tablet (6.25 mg total) by mouth 2 (two) times daily with a meal.   cilostazol (PLETAL) 100 MG tablet Take 1 tablet (100 mg total) by mouth 2 (two) times daily.   Fluticasone-Umeclidin-Vilant (TRELEGY ELLIPTA) 100-62.5-25 MCG/INH AEPB Inhale 1 puff into the lungs daily.   furosemide (LASIX) 40 MG tablet Take 0.5 tablets (20 mg total) by mouth daily.   irbesartan (AVAPRO) 150 MG tablet Take 150 mg by mouth daily.   sacubitril-valsartan (ENTRESTO) 49-51 MG Take 1 tablet by mouth 2 (two) times  daily.    There is no immunization history on file for this patient.    Objective:   Physical Exam BP 132/72 (BP Location: Left Arm, Cuff Size: Normal)   Pulse 68   Temp 98 F (36.7 C) (Temporal)   Ht 5\' 11"  (1.803 m)   Wt 211 lb 6.4 oz (95.9 kg)   SpO2 98% Comment: on RA  BMI 29.48 kg/m  GENERAL: Well-developed, well-nourished gentleman, no acute distress.  Fully ambulatory.  No conversational dyspnea. HEAD: Normocephalic, atraumatic.  EYES: Pupils equal, round, reactive to light.  No scleral icterus.  MOUTH: He relates he wears dentures.  Nose/mouth/throat not examined due to masking requirements for COVID 19. NECK: Supple. No thyromegaly. Trachea midline. No JVD.  No adenopathy. PULMONARY: Good air entry bilaterally.  Coarse breath sounds, mild end expiratory wheeze.   CARDIOVASCULAR: S1 and S2. Regular rate and rhythm.  Grade 2/6 mitral regurgitation murmur. ABDOMEN: Benign. MUSCULOSKELETAL: No joint deformity, no edema.  There is clubbing of the fingers and toes, mild in degree. NEUROLOGIC: No focal deficit, no gait disturbance, speech is fluent. SKIN: Intact,warm,dry. PSYCH: Mood and behavior normal.      Assessment & Plan:     ICD-10-CM   1. Stage 1 mild COPD by GOLD classification (HCC)  J44.9    Persistent issues with thick "mucus" Will give trial of increased Trelegy This will have increased ICS component    2. Shortness of breath  R06.02    Improved on his various cardiac medications Feels Trelegy helping as well    3. Ischemic cardiomyopathy  I25.5    This issue adds complexity to his management LVEF 25 to 30% Followed by cardiology    4. Grade II diastolic dysfunction  I51.89    This issue adds complexity to his management This will also impair exercise capacity    5. Former smoker  Z87.891    Congratulated on quitting smoking He has been referred to lung cancer screening LDCT schedule pending     Meds ordered this encounter  Medications    Fluticasone-Umeclidin-Vilant (TRELEGY ELLIPTA) 200-62.5-25 MCG/INH AEPB    Sig: Inhale 1 puff into the lungs daily.    Dispense:  14 each    Refill:  0   Patient has been given a trial of increased ICS component Trelegy to see if this helps with his excessive secretions.  Some of this may be a result of his recent discontinuation of smoking and should hopefully "level off".  He feels overall that he is improving.  He notes improvement with his cardiac medications.  We will see the patient in follow-up in 4 months time he is to contact [phone] prior to that time should any new difficulties arise.  [place], MD [facility]    *This note was dictated using voice recognition software/Dragon.  Despite best efforts to proofread, errors can occur which can change the meaning.  Any change was  purely unintentional.

## 2020-11-05 NOTE — Patient Instructions (Addendum)
We are giving you a trial of the stronger dose of Trelegy that this will be 200/62.5/25, 1 puff daily.  You have 14 days worth of this one.  Please make sure you rinse your mouth well after you use it.  You can use a little baking soda and rinse water or a mouthwash without alcohol.  Let us know how you are doing with this stronger dose of Trelegy so we can call the prescription in.   We will see him in follow-up in 4 months time call sooner should any new problems arise.   You should hopefully be hearing from the lung cancer screening soon.

## 2020-11-15 ENCOUNTER — Ambulatory Visit (INDEPENDENT_AMBULATORY_CARE_PROVIDER_SITE_OTHER): Payer: Medicare Other

## 2020-11-15 ENCOUNTER — Other Ambulatory Visit: Payer: Self-pay

## 2020-11-15 DIAGNOSIS — I739 Peripheral vascular disease, unspecified: Secondary | ICD-10-CM | POA: Diagnosis not present

## 2020-11-16 ENCOUNTER — Telehealth: Payer: Self-pay

## 2020-11-16 DIAGNOSIS — I739 Peripheral vascular disease, unspecified: Secondary | ICD-10-CM

## 2020-11-16 NOTE — Telephone Encounter (Signed)
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  Please refer to PAD clinic/Dr. Kirke Corin.  Keep follow-up appointment with myself.  Thank you

## 2020-11-16 NOTE — Telephone Encounter (Signed)
Called patient and informed him of the below documentation. Patient was very grateful for the follow up.

## 2020-11-22 ENCOUNTER — Encounter: Payer: Self-pay | Admitting: Cardiology

## 2020-11-22 ENCOUNTER — Other Ambulatory Visit: Payer: Self-pay

## 2020-11-22 ENCOUNTER — Ambulatory Visit (INDEPENDENT_AMBULATORY_CARE_PROVIDER_SITE_OTHER): Payer: Medicare Other | Admitting: Cardiology

## 2020-11-22 VITALS — BP 132/62 | HR 63 | Ht 71.0 in | Wt 211.0 lb

## 2020-11-22 DIAGNOSIS — I739 Peripheral vascular disease, unspecified: Secondary | ICD-10-CM

## 2020-11-22 DIAGNOSIS — I10 Essential (primary) hypertension: Secondary | ICD-10-CM | POA: Diagnosis not present

## 2020-11-22 DIAGNOSIS — I2510 Atherosclerotic heart disease of native coronary artery without angina pectoris: Secondary | ICD-10-CM

## 2020-11-22 DIAGNOSIS — I255 Ischemic cardiomyopathy: Secondary | ICD-10-CM

## 2020-11-22 MED ORDER — SPIRONOLACTONE 25 MG PO TABS: 12.5000 mg | ORAL_TABLET | Freq: Every day | ORAL | 3 refills | Status: AC

## 2020-11-22 NOTE — Patient Instructions (Signed)
Medication Instructions:   Your physician has recommended you make the following change in your medication:   START taking Aldactone 12.5 MG once a day.  *If you need a refill on your cardiac medications before your next appointment, please call your pharmacy*   Lab Work: None ordered If you have labs (blood work) drawn today and your tests are completely normal, you will receive your results only by: MyChart Message (if you have MyChart) OR A paper copy in the mail If you have any lab test that is abnormal or we need to change your treatment, we will call you to review the results.   Testing/Procedures: None ordered   Follow-Up: At [facility], you and your health needs are our priority.  As part of our continuing mission to provide you with exceptional heart care, we have created designated Provider Care Teams.  These Care Teams include your primary Cardiologist (physician) and Advanced Practice Providers (APPs -  Physician Assistants and Nurse Practitioners) who all work together to provide you with the care you need, when you need it.  We recommend signing up for the patient portal called "MyChart".  Sign up information is provided on this After Visit Summary.  MyChart is used to connect with patients for Virtual Visits (Telemedicine).  Patients are able to view lab/test results, encounter notes, upcoming appointments, etc.  Non-urgent messages can be sent to your provider as well.   To learn more about what you can do with MyChart, go to [email or web address].    Your next appointment:   5 week(s)   The format for your next appointment:   In Person  Provider:   Debbe Odea, MD ONLY   Other Instructions

## 2020-11-22 NOTE — Progress Notes (Signed)
Cardiology Office Note:    Date:  11/22/2020   ID:  Luis Madden, DOB 10-12-1950, MRN [id number]  PCP:  [facility]  Cardiologist:  None  Advanced Practice Provider:  No care team member to display Electrophysiologist:  None       Referring MD: Debbe Odea, MD   Chief Complaint  Patient presents with   Other    Follow up post LEA. Patient c.o leg pain. Meds reviewed verbally with patient.      History of Present Illness:    Luis Madden is a 70 y.o. male with a hx of CAD (CTO RCA, CTO left circumflex ), hypertension, former smoker x50+ years who presents for follow-up.    Previously seen due to shortness of breath with exertion.  Coronary CTA showed two-vessel chronic total occlusion.  Due to symptoms of claudication, cilostazol was started.  Peripheral arterial ultrasound was ordered.  Cardiac MRI was ordered to evaluate viability.  He still has leg pain with walking limiting his walking distance.  Denies edema, denies chest pain or shortness of breath with walking.  Currently on Coreg, Entresto.  Prior notes Coronary CTA obtained on 07/29/2020 showed two-vessel CTO involving RCA and left circumflex.  LAD mild stenosis in the midsegment.   Echocardiogram 08/2020 EF 30%   Past Medical History:  Diagnosis Date   Grade II diastolic dysfunction    Hypertension    Ischemic cardiomyopathy    LVEF 25%   Stage 1 mild COPD by GOLD classification (HCC)     History reviewed. No pertinent surgical history.  Current Medications: Current Meds  Medication Sig   albuterol (VENTOLIN HFA) 108 (90 Base) MCG/ACT inhaler Inhale 2 puffs into the lungs every 6 (six) hours as needed for wheezing or shortness of breath.   aspirin EC 81 MG tablet Take 1 tablet (81 mg total) by mouth daily. Swallow whole.   atorvastatin (LIPITOR) 40 MG tablet Take 1 tablet (40 mg total) by mouth daily.   carvedilol (COREG) 6.25 MG tablet Take 1 tablet (6.25 mg  total) by mouth 2 (two) times daily with a meal.   cilostazol (PLETAL) 100 MG tablet Take 1 tablet (100 mg total) by mouth 2 (two) times daily.   Fluticasone-Umeclidin-Vilant (TRELEGY ELLIPTA) 200-62.5-25 MCG/INH AEPB Inhale 1 puff into the lungs daily.   furosemide (LASIX) 40 MG tablet Take 0.5 tablets (20 mg total) by mouth daily.   sacubitril-valsartan (ENTRESTO) 49-51 MG Take 1 tablet by mouth 2 (two) times daily.   spironolactone (ALDACTONE) 25 MG tablet Take 0.5 tablets (12.5 mg total) by mouth daily.   [DISCONTINUED] irbesartan (AVAPRO) 150 MG tablet Take 150 mg by mouth daily.     Allergies:   Patient has no known allergies.   Social History   Socioeconomic History   Marital status: Divorced    Spouse name: Not on file   Number of children: Not on file   Years of education: Not on file   Highest education level: Not on file  Occupational History   Not on file  Tobacco Use   Smoking status: Former    Packs/day: 2.00    Years: 57.00    Pack years: 114.00    Types: Cigarettes    Quit date: 07/02/2020    Years since quitting: 0.3   Smokeless tobacco: Former  Substance and Sexual Activity   Alcohol use: Yes    Comment: " 3-4 beers a month"   Drug use: Never  Sexual activity: Not on file  Other Topics Concern   Not on file  Social History Narrative   Not on file   Social Determinants of Health   Financial Resource Strain: Not on file  Food Insecurity: Not on file  Transportation Needs: Not on file  Physical Activity: Not on file  Stress: Not on file  Social Connections: Not on file     Family History: The patient's family history is not on file.  ROS:   Please see the history of present illness.     All other systems reviewed and are negative.  EKGs/Labs/Other Studies Reviewed:    The following studies were reviewed today:   EKG:  EKG is  ordered today.  The ekg ordered today demonstrates normal sinus rhythm, LVH by voltage criteria.  Recent  Labs: 07/07/2020: Hemoglobin 16.0; Platelets 211 09/20/2020: BUN 25; Creatinine, Ser 1.34; Potassium 4.4; Sodium 137  Recent Lipid Panel    Component Value Date/Time   CHOL 199 07/13/2020 1040   TRIG 134 07/13/2020 1040   HDL 25 (L) 07/13/2020 1040   CHOLHDL 8.0 (H) 07/13/2020 1040   LDLCALC 149 (H) 07/13/2020 1040     Risk Assessment/Calculations:      Physical Exam:    VS:  BP 132/62 (BP Location: Left Arm, Patient Position: Sitting, Cuff Size: Normal)   Pulse 63   Ht 5\' 11"  (1.803 m)   Wt 211 lb (95.7 kg)   SpO2 95%   BMI 29.43 kg/m     Wt Readings from Last 3 Encounters:  11/22/20 211 lb (95.7 kg)  11/05/20 211 lb 6.4 oz (95.9 kg)  10/07/20 210 lb 12.8 oz (95.6 kg)     GEN:  Well nourished, well developed in no acute distress HEENT: Normal NECK: No JVD; No carotid bruits LYMPHATICS: No lymphadenopathy CARDIAC: RRR, faint systolic murmur, RESPIRATORY:  Clear to auscultation without rales, wheezing or rhonchi  ABDOMEN: Soft, non-tender, non-distended MUSCULOSKELETAL:  No edema; No deformity  SKIN: Warm and dry NEUROLOGIC:  Alert and oriented x 3 PSYCHIATRIC:  Normal affect   ASSESSMENT:    1. Coronary artery disease involving native coronary artery of native heart, unspecified whether angina present   2. Ischemic cardiomyopathy   3. Primary hypertension   4. PAD (peripheral artery disease) (HCC)     PLAN:    In order of problems listed above:  Significant two-vessel CAD on coronary CTA.  CTO of RCA and mid left circumflex. LAD with non obstructive disease.  Cardiac MRI 08/2020 showed severely reduced EF, 25%, transmural LGE involving LV inferior and inferolateral walls implying inferior inferolateral walls are nonviable.  Cont aspirin, Lipitor 40 mg daily. Ischemic cardiomyopathy, EF 25% , TTE EF 30%.  Continue Coreg 6.25 mg twice daily, heart rate 63., Entresto 49/51 twice daily.  Start Aldactone 12.5 mg daily. Hypertension, BP controlled.  Aldactone, Coreg,  Entresto as above. Claudication, bilateral PAD, significant right SFA stenosis, left anterior tibial artery stenosis.  Refer to PAD clinic/Dr. 09/2020.  Continue cilostazol 100 mg twice daily, Lipitor.    Follow-up in 5 weeks for Entresto titration if BP permits   Medication Adjustments/Labs and Tests Ordered: Current medicines are reviewed at length with the patient today.  Concerns regarding medicines are outlined above.  No orders of the defined types were placed in this encounter.  Meds ordered this encounter  Medications   spironolactone (ALDACTONE) 25 MG tablet    Sig: Take 0.5 tablets (12.5 mg total) by mouth daily.  Dispense:  30 tablet    Refill:  3     Patient Instructions  Medication Instructions:   Your physician has recommended you make the following change in your medication:   START taking Aldactone 12.5 MG once a day.  *If you need a refill on your cardiac medications before your next appointment, please call your pharmacy*   Lab Work: None ordered If you have labs (blood work) drawn today and your tests are completely normal, you will receive your results only by: MyChart Message (if you have MyChart) OR A paper copy in the mail If you have any lab test that is abnormal or we need to change your treatment, we will call you to review the results.   Testing/Procedures: None ordered   Follow-Up: At [facility], you and your health needs are our priority.  As part of our continuing mission to provide you with exceptional heart care, we have created designated Provider Care Teams.  These Care Teams include your primary Cardiologist (physician) and Advanced Practice Providers (APPs -  Physician Assistants and Nurse Practitioners) who all work together to provide you with the care you need, when you need it.  We recommend signing up for the patient portal called "MyChart".  Sign up information is provided on this After Visit Summary.  MyChart is used to  connect with patients for Virtual Visits (Telemedicine).  Patients are able to view lab/test results, encounter notes, upcoming appointments, etc.  Non-urgent messages can be sent to your provider as well.   To learn more about what you can do with MyChart, go to [email or web address].    Your next appointment:   5 week(s)   The format for your next appointment:   In Person  Provider:   Debbe Odea, MD ONLY   Other Instructions    Signed, Debbe Odea, MD  11/22/2020 12:02 PM    [facility]

## 2020-12-02 ENCOUNTER — Other Ambulatory Visit: Payer: Self-pay

## 2020-12-02 ENCOUNTER — Ambulatory Visit (INDEPENDENT_AMBULATORY_CARE_PROVIDER_SITE_OTHER): Payer: Medicare Other | Admitting: Cardiovascular Disease

## 2020-12-02 ENCOUNTER — Encounter: Payer: Self-pay | Admitting: Cardiovascular Disease

## 2020-12-02 VITALS — BP 102/68 | HR 70 | Ht 71.0 in | Wt 210.4 lb

## 2020-12-02 DIAGNOSIS — I739 Peripheral vascular disease, unspecified: Secondary | ICD-10-CM

## 2020-12-02 DIAGNOSIS — I255 Ischemic cardiomyopathy: Secondary | ICD-10-CM

## 2020-12-02 DIAGNOSIS — I2510 Atherosclerotic heart disease of native coronary artery without angina pectoris: Secondary | ICD-10-CM

## 2020-12-02 DIAGNOSIS — E785 Hyperlipidemia, unspecified: Secondary | ICD-10-CM

## 2020-12-02 DIAGNOSIS — I5022 Chronic systolic (congestive) heart failure: Secondary | ICD-10-CM | POA: Diagnosis not present

## 2020-12-02 NOTE — Progress Notes (Signed)
Cardiology Office Note   Date:  12/02/2020   ID:  Luis Madden, DOB [birth year], MRN [id number]  PCP:  Pcp, No  [facility]  Chief Complaint  Patient presents with   Other    C/o lower back and leg pain. Meds reviewed verbally with pt.      History of Present Illness: Luis Madden is a 70 y.o. male who was referred by Dr. Azucena Cecil for evaluation management of peripheral arterial disease.  The patient has prolonged history of tobacco use but quit smoking few months ago.  He is not diabetic. He was evaluated recently for exertional dyspnea.  Cardiac CTA showed a calcium score of 907 with chronic occlusion of the right coronary artery proximally with moderate stenosis in the mid left circumflex.  He had cardiac MRI done which showed an EF of 25% with no significant viability in the inferior wall.  Based on this, he is being treated medically for CAD and heart failure. The patient complained of bilateral exertional leg pain and thus he was started on cilostazol.  He underwent noninvasive vascular studies which showed normal ABI on the left and mildly reduced on the right.  Duplex showed severe stenosis in the right distal SFA.  The patient reports lower back pain that radiates down both legs equally with numbness.  This started about 1 year ago.  This can happen with walking but is also worse when he lies down.  In addition, he does complain of stiffening of both calfs especially that on the right side.   Past Medical History:  Diagnosis Date   Grade II diastolic dysfunction    Hypertension    Ischemic cardiomyopathy    LVEF 25%   Stage 1 mild COPD by GOLD classification (HCC)     History reviewed. No pertinent surgical history.   Current Outpatient Medications  Medication Sig Dispense Refill   albuterol (VENTOLIN HFA) 108 (90 Base) MCG/ACT inhaler Inhale 2 puffs into the lungs every 6 (six) hours as needed for wheezing or shortness of breath. 8 g 2    aspirin EC 81 MG tablet Take 1 tablet (81 mg total) by mouth daily. Swallow whole. 90 tablet 3   atorvastatin (LIPITOR) 40 MG tablet Take 1 tablet (40 mg total) by mouth daily. 30 tablet 5   carvedilol (COREG) 6.25 MG tablet Take 1 tablet (6.25 mg total) by mouth 2 (two) times daily with a meal. 180 tablet 0   cilostazol (PLETAL) 100 MG tablet Take 1 tablet (100 mg total) by mouth 2 (two) times daily. 60 tablet 5   Fluticasone-Umeclidin-Vilant (TRELEGY ELLIPTA) 200-62.5-25 MCG/INH AEPB Inhale 1 puff into the lungs daily. 14 each 0   furosemide (LASIX) 40 MG tablet Take 0.5 tablets (20 mg total) by mouth daily. 30 tablet 5   sacubitril-valsartan (ENTRESTO) 49-51 MG Take 1 tablet by mouth 2 (two) times daily. 60 tablet 5   spironolactone (ALDACTONE) 25 MG tablet Take 0.5 tablets (12.5 mg total) by mouth daily. 30 tablet 3   No current facility-administered medications for this visit.    Allergies:   Patient has no known allergies.    Social History:  The patient  reports that he quit smoking about 5 months ago. His smoking use included cigarettes. He has a 114.00 pack-year smoking history. He has quit using smokeless tobacco. He reports current alcohol use. He reports that he does not use drugs.   Family History:  The patient's family history is not  on file.    ROS:  Please see the history of present illness.   Otherwise, review of systems are positive for none.   All other systems are reviewed and negative.    PHYSICAL EXAM: VS:  BP 102/68 (BP Location: Left Arm, Patient Position: Sitting, Cuff Size: Normal)   Pulse 70   Ht 5\' 11"  (1.803 m)   Wt 210 lb 6 oz (95.4 kg)   SpO2 97%   BMI 29.34 kg/m  , BMI Body mass index is 29.34 kg/m. GEN: Well nourished, well developed, in no acute distress  HEENT: normal  Neck: no JVD, carotid bruits, or masses Cardiac: RRR; no murmurs, rubs, or gallops,no edema  Respiratory:  clear to auscultation bilaterally, normal work of breathing GI: soft,  nontender, nondistended, + BS MS: no deformity or atrophy  Skin: warm and dry, no rash Neuro:  Strength and sensation are intact Psych: euthymic mood, full affect Vascular exam: Radial pulse is normal bilaterally.  Femoral pulses normal bilaterally.  Dorsalis pedis and posterior tibial is +1 on the left and not palpable on the right.   EKG:  EKG is ordered today. The ekg ordered today demonstrates normal sinus rhythm with no significant ST or T wave changes.   Recent Labs: 07/07/2020: Hemoglobin 16.0; Platelets 211 09/20/2020: BUN 25; Creatinine, Ser 1.34; Potassium 4.4; Sodium 137    Lipid Panel    Component Value Date/Time   CHOL 199 07/13/2020 1040   TRIG 134 07/13/2020 1040   HDL 25 (L) 07/13/2020 1040   CHOLHDL 8.0 (H) 07/13/2020 1040   LDLCALC 149 (H) 07/13/2020 1040      Wt Readings from Last 3 Encounters:  12/02/20 210 lb 6 oz (95.4 kg)  11/22/20 211 lb (95.7 kg)  11/05/20 211 lb 6.4 oz (95.9 kg)        No flowsheet data found.    ASSESSMENT AND PLAN:  1.  Peripheral arterial disease: The patient has evidence of severe stenosis in the right distal SFA.  This explains his significant right calf claudication.  However, it does not really explain his severe symptoms of low back pain radiating to both legs.  His femoral pulses are normal and I do not suspect inflow disease.  I suspect he has another etiology for his lower back pain most likely L-spine etiology.  The patient wants that to be evaluated first before considering revascularization of his peripheral arterial disease which I think is reasonable. Given that his symptoms have persisted for more than a year, I am referring him for evaluation.  2.  Coronary artery disease: The patient has chronic total occlusion of the RCA and mid left circumflex with nonobstructive disease in the LAD.  He is being treated medically due to lack of viability on cardiac MRI.  Currently with no anginal symptoms.  3.  Chronic  systolic heart failure due to ischemic cardiomyopathy: He is on optimal medical therapy.  4.  Hyperlipidemia: I agree with atorvastatin.  His lipid profile before starting the medication showed an LDL of 145.  He will require a follow-up lipid profile with target LDL of less than 70.    Disposition: Refer to orthopedics to evaluate low back pain.  Follow-up with me in 6 weeks to consider revascularization of the right SFA.  Signed,  01/05/21, MD  12/02/2020 10:48 AM    [facility]

## 2020-12-02 NOTE — Patient Instructions (Signed)
Medication Instructions:  Your physician recommends that you continue on your current medications as directed. Please refer to the Current Medication list given to you today.   *If you need a refill on your cardiac medications before your next appointment, please call your pharmacy*   Lab Work: None ordered If you have labs (blood work) drawn today and your tests are completely normal, you will receive your results only by: MyChart Message (if you have MyChart) OR A paper copy in the mail If you have any lab test that is abnormal or we need to change your treatment, we will call you to review the results.   Testing/Procedures: None ordered   Follow-Up: At [facility], you and your health needs are our priority.  As part of our continuing mission to provide you with exceptional heart care, we have created designated Provider Care Teams.  These Care Teams include your primary Cardiologist (physician) and Advanced Practice Providers (APPs -  Physician Assistants and Nurse Practitioners) who all work together to provide you with the care you need, when you need it.  We recommend signing up for the patient portal called "MyChart".  Sign up information is provided on this After Visit Summary.  MyChart is used to connect with patients for Virtual Visits (Telemedicine).  Patients are able to view lab/test results, encounter notes, upcoming appointments, etc.  Non-urgent messages can be sent to your provider as well.   To learn more about what you can do with MyChart, go to [email or web address].    Your next appointment:   6 week(s)  The format for your next appointment:   In Person  Provider:   Lorine Bears, MD   Other Instructions Dr. Kirke Corin recommends that you follow up with [facility] for evaluation of your back pain. No referral needed. They have an office located at: [address] [place], [place] Office telephone: [phone]

## 2020-12-07 NOTE — Addendum Note (Signed)
Addended by: Kendrick Fries on: 12/07/2020 08:20 AM   Modules accepted: Orders

## 2020-12-13 ENCOUNTER — Telehealth: Payer: Self-pay | Admitting: Physical Medicine and Rehabilitation

## 2020-12-13 NOTE — Telephone Encounter (Signed)
Pt called wanting to get an appt for his lower back and bilateral leg pain. He states the pain in his legs go down the entirety of his legs and mostly happens when he's walking too much. Pt would like a CB please.   [phone]

## 2020-12-13 NOTE — Telephone Encounter (Signed)
Scheduled for OV on 9/14.

## 2020-12-15 ENCOUNTER — Encounter: Payer: Self-pay | Admitting: Physical Medicine and Rehabilitation

## 2020-12-15 ENCOUNTER — Other Ambulatory Visit: Payer: Self-pay

## 2020-12-15 ENCOUNTER — Ambulatory Visit (INDEPENDENT_AMBULATORY_CARE_PROVIDER_SITE_OTHER): Payer: Medicare Other | Admitting: Physical Medicine and Rehabilitation

## 2020-12-15 ENCOUNTER — Ambulatory Visit: Payer: Self-pay

## 2020-12-15 VITALS — BP 161/75 | HR 71

## 2020-12-15 DIAGNOSIS — M47819 Spondylosis without myelopathy or radiculopathy, site unspecified: Secondary | ICD-10-CM | POA: Diagnosis not present

## 2020-12-15 DIAGNOSIS — G8929 Other chronic pain: Secondary | ICD-10-CM

## 2020-12-15 DIAGNOSIS — M5442 Lumbago with sciatica, left side: Secondary | ICD-10-CM | POA: Diagnosis not present

## 2020-12-15 DIAGNOSIS — I255 Ischemic cardiomyopathy: Secondary | ICD-10-CM

## 2020-12-15 DIAGNOSIS — M47816 Spondylosis without myelopathy or radiculopathy, lumbar region: Secondary | ICD-10-CM

## 2020-12-15 DIAGNOSIS — M5416 Radiculopathy, lumbar region: Secondary | ICD-10-CM

## 2020-12-15 DIAGNOSIS — M5441 Lumbago with sciatica, right side: Secondary | ICD-10-CM

## 2020-12-15 NOTE — Progress Notes (Signed)
Luis Madden - 70 y.o. male MRN [id number]  Date of birth: [birth year]  Office Visit Note: Visit Date: 12/15/2020 PCP: Pcp, No Referred by: No ref. provider found  Subjective: Chief Complaint  Patient presents with   Lower Back - Pain   Left Leg - Pain   Right Leg - Pain   HPI: Man Luis Madden is a 70 y.o. male who comes in today as a self referral for evaluation of bilateral lower back pain radiating to buttocks and down to lateral ankles. Patient reports pain has been ongoing for 1 year. Patient reports pain is exacerbated by walking, bending over and prolonged standing. He describes pain as soreness sensation and currently rates as 7 out of 10. Patient reports some pain relief with stretching exercises, rest and Ibuprofen. Patient's lumbar x-ray from today's visit exhibits show normal anatomic alignment with no listhesis. There is multi-level facet degenerative changes and disc height loss at L5-S1. Marland Kitchen Patient denies focal weakness. Patient denies recent trauma or falls.  He has had no bowel or bladder changes.  No specific night pain or fevers or chills.  Patient's course is complicated by hypertension, ischemic cardiomyopathy, COPD and peripheral artery disease. Patient is currently being managed by Dr. Lorine Bears at [facility]. Patient has severe stenosis of right superficial femoral artery and is considering revascularization procedure.   He resides in [place] but has been in the [place] area with his daughter and will stay in this area while he is completing a lot of his medical care including the cardiac and cardiovascular work-up.  Review of Systems  Musculoskeletal:  Positive for back pain.  Neurological:  Negative for sensory change, focal weakness and weakness.  All other systems reviewed and are negative. Otherwise per HPI.  Assessment & Plan: Visit Diagnoses:    ICD-10-CM   1. Lumbar radiculopathy  M54.16     2. 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 Chronic bilateral low back pain with bilateral sciatica  M54.42 XR Lumbar Spine Complete   M54.41 Ambulatory referral to Physical Medicine Rehab   G89.29 MR LUMBAR SPINE WO CONTRAST    4. Facet hypertrophy of lumbar region  M47.816        Plan: Findings:  Chronic, worsening and severe bilateral lower back pain radiating to buttocks, lateral leg and ankles.  Patient's clinical presentation and exam are consistent with an S1 nerve pattern.  Patient continues to have severe pain despite good conservative therapies.  We discussed patient's lumbar x-ray with him in detail using imaging and spine model.  We believe the next step is to perform a diagnostic and hopefully therapeutic bilateral S1 transforaminal epidural steroid injection.  We also spoke with patient in detail about further imaging.  We placed an order for a lumbar MRI today.  Patient encouraged to continue home exercise regimen and use of medications to help alleviate pain.  He is agreeable with plan of care and we feel that we can get him back in the office quickly to perform the injection.  We will also follow-up with patient after his MRI has been completed to review the results.  No red flags noted today upon exam.   Meds & Orders: No orders of the defined types were placed in this encounter.   Orders Placed This Encounter  Procedures   XR Lumbar Spine Complete   MR LUMBAR SPINE WO CONTRAST   Ambulatory referral to Physical Medicine Rehab    Follow-up:  Return in about 1 week (around 12/22/2020) for Bilateral S1-S2 transforaminal epidural steroid injection.   Procedures: No procedures performed      Clinical History: No specialty comments available.   He reports that he quit smoking about 5 months ago. His smoking use included cigarettes. He has a 114.00 pack-year smoking history. He has quit using smokeless tobacco. No results for input(s): HGBA1C, LABURIC in the last 8760  hours.  Objective:  VS:  HT:    WT:   BMI:     BP:(!) 161/75  HR:71bpm  TEMP: ( )  RESP:  Physical Exam HENT:     Head: Normocephalic and atraumatic.     Right Ear: Tympanic membrane normal.     Left Ear: Tympanic membrane normal.     Nose: Nose normal.     Mouth/Throat:     Mouth: Mucous membranes are moist.  Eyes:     Pupils: Pupils are equal, round, and reactive to light.  Cardiovascular:     Rate and Rhythm: Normal rate.     Pulses: Normal pulses.  Pulmonary:     Effort: Pulmonary effort is normal.  Abdominal:     General: Abdomen is flat. There is no distension.  Musculoskeletal:        General: Tenderness present.     Cervical back: Normal range of motion.     Comments: Pt rises from seated position to standing without difficulty. Good lumbar range of motion. Strong distal strength without clonus, no pain upon palpation of greater trochanters. Sensation intact bilaterally. Dysesthesias noted to S1 dermatome bilaterally. Walks independently, gait steady.    Skin:    General: Skin is warm and dry.     Capillary Refill: Capillary refill takes less than 2 seconds.  Neurological:     General: No focal deficit present.     Mental Status: He is alert.  Psychiatric:        Mood and Affect: Mood normal.    Ortho Exam  Imaging: No results found.  Past Medical/Family/Surgical/Social History: Medications & Allergies reviewed per EMR, new medications updated. Patient Active Problem List   Diagnosis Date Noted   Grade II diastolic dysfunction 11/05/2020   Hypertension 11/05/2020   Ischemic cardiomyopathy 11/05/2020   Stage 1 mild COPD by GOLD classification (HCC) 11/05/2020   Past Medical History:  Diagnosis Date   Grade II diastolic dysfunction    Hypertension    Ischemic cardiomyopathy    LVEF 25%   Stage 1 mild COPD by GOLD classification (HCC)    History reviewed. No pertinent family history. History reviewed. No pertinent surgical history. Social History    Occupational History   Not on file  Tobacco Use   Smoking status: Former    Packs/day: 2.00    Years: 57.00    Pack years: 114.00    Types: Cigarettes    Quit date: 07/02/2020    Years since quitting: 0.4   Smokeless tobacco: Former  Substance and Sexual Activity   Alcohol use: Yes    Comment: " 3-4 beers a month"   Drug use: Never   Sexual activity: Not on file

## 2020-12-15 NOTE — Progress Notes (Signed)
Pt state lower back pain that travels down both legs. Pt state climbing stairs, walking and cutting glass makes the pain worse. Pt state he feels light bolts pain that run down his legs. Pt state takes over the counter pain med to help ease his pain.  Numeric Pain Rating Scale and Functional Assessment Average Pain 10 Pain Right Now 3 My pain is intermittent, sharp, burning, and stabbing Pain is worse with: walking, standing, and some activites Pain improves with: medication   In the last MONTH (on 0-10 scale) has pain interfered with the following?  1. General activity like being  able to carry out your everyday physical activities such as walking, climbing stairs, carrying groceries, or moving a chair?  Rating(7)  2. Relation with others like being able to carry out your usual social activities and roles such as  activities at home, at work and in your community. Rating(8)  3. Enjoyment of life such that you have  been bothered by emotional problems such as feeling anxious, depressed or irritable?  Rating(9)

## 2020-12-16 ENCOUNTER — Other Ambulatory Visit: Payer: Self-pay | Admitting: *Deleted

## 2020-12-16 MED ORDER — CARVEDILOL 6.25 MG PO TABS
6.2500 mg | ORAL_TABLET | Freq: Two times a day (BID) | ORAL | 0 refills | Status: DC
Start: 2020-12-16 — End: 2021-03-17

## 2020-12-23 ENCOUNTER — Other Ambulatory Visit: Payer: Self-pay

## 2020-12-23 ENCOUNTER — Ambulatory Visit (INDEPENDENT_AMBULATORY_CARE_PROVIDER_SITE_OTHER): Payer: Medicare Other | Admitting: Physical Medicine and Rehabilitation

## 2020-12-23 ENCOUNTER — Ambulatory Visit: Payer: Self-pay

## 2020-12-23 VITALS — BP 123/75 | HR 77

## 2020-12-23 DIAGNOSIS — M5416 Radiculopathy, lumbar region: Secondary | ICD-10-CM | POA: Diagnosis not present

## 2020-12-23 MED ORDER — BETAMETHASONE SOD PHOS & ACET 6 (3-3) MG/ML IJ SUSP
12.0000 mg | Freq: Once | INTRAMUSCULAR | Status: AC
  Administered 2020-12-23: 12 mg

## 2020-12-23 NOTE — Patient Instructions (Signed)

## 2020-12-23 NOTE — Progress Notes (Signed)
Pt state lower back pain that travels to his buttocks and down both leg and ankles. Pt state any movement makes the pain worse. Pt state he takes over the counter pain meds to help ease his pain.  Numeric Pain Rating Scale and Functional Assessment Average Pain 4   In the last MONTH (on 0-10 scale) has pain interfered with the following?  1. General activity like being  able to carry out your everyday physical activities such as walking, climbing stairs, carrying groceries, or moving a chair?  Rating(10)   +Driver, -BT, -Dye Allergies.

## 2020-12-27 ENCOUNTER — Telehealth: Payer: Self-pay | Admitting: Cardiology

## 2020-12-27 NOTE — Telephone Encounter (Signed)
Called patient and informed him that he can call [employer] for refills, that he was approved until 04/02/21. Patient informed me that he only has enough on hand to get through tomorrow. He has an appointment with [place] at the end of the week on 12/31/20. I left a bottle of samples for him at the front desk to get him through the appointment and patient will call [employer] after his appointment in case Dr. Azucena Cecil changes the dose.  Patient will be by today to pick up samples and was very grateful for the call.

## 2020-12-27 NOTE — Telephone Encounter (Signed)
Patient calling  States that he is ready to refill Sherryll Burger Would like to know if he needs to fill out paperwork again to get assistance Please call to discuss

## 2020-12-28 ENCOUNTER — Other Ambulatory Visit: Payer: Medicare Other

## 2020-12-29 ENCOUNTER — Telehealth (HOSPITAL_COMMUNITY): Payer: Self-pay | Admitting: Cardiology

## 2020-12-29 NOTE — Telephone Encounter (Signed)
Error

## 2020-12-31 ENCOUNTER — Ambulatory Visit (INDEPENDENT_AMBULATORY_CARE_PROVIDER_SITE_OTHER): Payer: Medicare Other | Admitting: Cardiology

## 2020-12-31 ENCOUNTER — Encounter: Payer: Self-pay | Admitting: Cardiology

## 2020-12-31 ENCOUNTER — Other Ambulatory Visit: Payer: Self-pay

## 2020-12-31 VITALS — BP 110/78 | HR 79 | Ht 71.0 in | Wt 210.0 lb

## 2020-12-31 DIAGNOSIS — I10 Essential (primary) hypertension: Secondary | ICD-10-CM

## 2020-12-31 DIAGNOSIS — I2510 Atherosclerotic heart disease of native coronary artery without angina pectoris: Secondary | ICD-10-CM | POA: Diagnosis not present

## 2020-12-31 DIAGNOSIS — I739 Peripheral vascular disease, unspecified: Secondary | ICD-10-CM

## 2020-12-31 DIAGNOSIS — I255 Ischemic cardiomyopathy: Secondary | ICD-10-CM

## 2020-12-31 MED ORDER — FUROSEMIDE 40 MG PO TABS: 20.0000 mg | ORAL_TABLET | Freq: Every day | ORAL | 5 refills | Status: AC | PRN

## 2020-12-31 NOTE — Progress Notes (Signed)
Luis Madden - 70 y.o. male MRN [id number]  Date of birth: 1950/12/24  Office Visit Note: Visit Date: 12/23/2020 PCP: Pcp, No Referred by: Tyrell Antonio, MD  Subjective: Chief Complaint  Patient presents with   Lower Back - Pain   Left Leg - Pain   Right Leg - Pain   Right Ankle - Pain   Left Ankle - Pain   HPI:  Luis Madden is a 70 y.o. male who comes in today at the request of Ellin Goodie, FNP for planned Bilateral S1-2 Lumbar Transforaminal epidural steroid injection with fluoroscopic guidance.  The patient has failed conservative care including home exercise, medications, time and activity modification.  This injection will be diagnostic and hopefully therapeutic.  Please see requesting physician notes for further details and justification.   ROS Otherwise per HPI.  Assessment & Plan: Visit Diagnoses:    ICD-10-CM   1. Lumbar radiculopathy  M54.16 XR C-ARM NO REPORT    Epidural Steroid injection    betamethasone acetate-betamethasone sodium phosphate (CELESTONE) injection 12 mg      Plan: No additional findings.   Meds & Orders:  Meds ordered this encounter  Medications   betamethasone acetate-betamethasone sodium phosphate (CELESTONE) injection 12 mg    Orders Placed This Encounter  Procedures   XR C-ARM NO REPORT   Epidural Steroid injection    Follow-up: Return if symptoms worsen or fail to improve.   Procedures: No procedures performed  S1 Lumbosacral Transforaminal Epidural Steroid Injection - Sub-Pedicular Approach with Fluoroscopic Guidance   Patient: Luis Madden      Date of Birth: 06/02/1950 MRN: [id number] PCP: Pcp, No      Visit Date: 12/23/2020   Universal Protocol:    Date/Time: 09/30/225:02 PM  Consent Given By: the patient  Position:  PRONE  Additional Comments: Vital signs were monitored before and after the procedure. Patient was prepped and draped in the usual sterile fashion. The correct patient, procedure, and  site was verified.   Injection Procedure Details:  Procedure Site One Meds Administered:  Meds ordered this encounter  Medications   betamethasone acetate-betamethasone sodium phosphate (CELESTONE) injection 12 mg    Laterality: Bilateral  Location/Site:  S1 Foramen   Needle size: 22 ga.  Needle type: Spinal  Needle Placement: Transforaminal  Findings:   -Comments: Excellent flow of contrast along the nerve, nerve root and into the epidural space.  Epidurogram: Contrast epidurogram showed no nerve root cut off or restricted flow pattern.  Procedure Details: After squaring off the sacral end-plate to get a true AP view, the C-arm was positioned so that the best possible view of the S1 foramen was visualized. The soft tissues overlying this structure were infiltrated with 2-3 ml. of 1% Lidocaine without Epinephrine.    The spinal needle was inserted toward the target using a "trajectory" view along the fluoroscope beam.  Under AP and lateral visualization, the needle was advanced so it did not puncture dura. Biplanar projections were used to confirm position. Aspiration was confirmed to be negative for CSF and/or blood. A 1-2 ml. volume of Isovue-250 was injected and flow of contrast was noted at each level. Radiographs were obtained for documentation purposes.   After attaining the desired flow of contrast documented above, a 0.5 to 1.0 ml test dose of 0.25% Marcaine was injected into each respective transforaminal space.  The patient was observed for 90 seconds post injection.  After no sensory deficits were reported, and normal lower extremity motor function was noted,  the above injectate was administered so that equal amounts of the injectate were placed at each foramen (level) into the transforaminal epidural space.   Additional Comments:  The patient tolerated the procedure well Dressing: Band-Aid with 2 x 2 sterile gauze    Post-procedure details: Patient was  observed during the procedure. Post-procedure instructions were reviewed.  Patient left the clinic in stable condition.   Clinical History: No specialty comments available.     Objective:  VS:  HT:    WT:   BMI:     BP:123/75  HR:77bpm  TEMP: ( )  RESP:  Physical Exam Vitals and nursing note reviewed.  Constitutional:      General: He is not in acute distress.    Appearance: Normal appearance. He is not ill-appearing.  HENT:     Head: Normocephalic and atraumatic.     Right Ear: External ear normal.     Left Ear: External ear normal.     Nose: No congestion.  Eyes:     Extraocular Movements: Extraocular movements intact.  Cardiovascular:     Rate and Rhythm: Normal rate.     Pulses: Normal pulses.  Pulmonary:     Effort: Pulmonary effort is normal. No respiratory distress.  Abdominal:     General: There is no distension.     Palpations: Abdomen is soft.  Musculoskeletal:        General: No tenderness or signs of injury.     Cervical back: Neck supple.     Right lower leg: No edema.     Left lower leg: No edema.     Comments: Patient has good distal strength without clonus.  Skin:    Findings: No erythema or rash.  Neurological:     General: No focal deficit present.     Mental Status: He is alert and oriented to person, place, and time.     Sensory: No sensory deficit.     Motor: No weakness or abnormal muscle tone.     Coordination: Coordination normal.  Psychiatric:        Mood and Affect: Mood normal.        Behavior: Behavior normal.     Imaging: No results found.

## 2020-12-31 NOTE — Patient Instructions (Signed)
Medication Instructions:   Your physician has recommended you make the following change in your medication:    STOP taking Cilostazol.  2.  Take Lasix 20 MG only as needed for swelling.    *If you need a refill on your cardiac medications before your next appointment, please call your pharmacy*   Lab Work: None ordered If you have labs (blood work) drawn today and your tests are completely normal, you will receive your results only by: MyChart Message (if you have MyChart) OR A paper copy in the mail If you have any lab test that is abnormal or we need to change your treatment, we will call you to review the results.   Testing/Procedures: None ordered   Follow-Up: At [facility], you and your health needs are our priority.  As part of our continuing mission to provide you with exceptional heart care, we have created designated Provider Care Teams.  These Care Teams include your primary Cardiologist (physician) and Advanced Practice Providers (APPs -  Physician Assistants and Nurse Practitioners) who all work together to provide you with the care you need, when you need it.  We recommend signing up for the patient portal called "MyChart".  Sign up information is provided on this After Visit Summary.  MyChart is used to connect with patients for Virtual Visits (Telemedicine).  Patients are able to view lab/test results, encounter notes, upcoming appointments, etc.  Non-urgent messages can be sent to your provider as well.   To learn more about what you can do with MyChart, go to [email or web address].    Your next appointment:   Follow up as needed   The format for your next appointment:   In Person  Provider:   Debbe Odea, MD   Other Instructions

## 2020-12-31 NOTE — Progress Notes (Signed)
Cardiology Office Note:    Date:  12/31/2020   ID:  Luis Madden, DOB 12-10-1950, MRN [id number]  PCP:  [facility]  Cardiologist:  None  Advanced Practice Provider:  No care team member to display Electrophysiologist:  None       Referring MD: No ref. provider found   Chief Complaint  Patient presents with   Follow-up    Patient leaving to go back home to [facility] referral to cardiologist in his area. Patient would also like to discuss whether or not he still needs to take all the medications he is on.     History of Present Illness:    Luis Madden is a 70 y.o. male with a hx of CAD (CTO RCA, CTO left circumflex ), hypertension, [name] x50+ years who presents for follow-up.   Denies chest pain, shortness of breath is much improved with stopping smoking.  Saw back pain specialist due to degenerative joint disease, and possible sciatica.  Had a back injection which significantly helped with his back and leg pains.  States being able to walk farther, more his lawn without any symptoms.  Denies edema.  Takes all medications as prescribed.  He plans to move back to his home in [place] and will establish care with a local cardiologist when he returns.  States being very appreciative of the care he is received with everyone so far.  Prior notes Coronary CTA obtained on 07/29/2020 showed two-vessel CTO involving RCA and left circumflex.  LAD mild stenosis in the midsegment.   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   aspirin EC 81 MG tablet Take 1 tablet (81 mg total) by mouth daily. Swallow whole.   atorvastatin (LIPITOR) 40 MG tablet Take 1 tablet (40 mg total) by mouth daily.   carvedilol (COREG) 6.25 MG tablet Take 1 tablet (6.25 mg total) by mouth 2 (two) times daily with a meal.   Fluticasone-Umeclidin-Vilant (TRELEGY ELLIPTA) 200-62.5-25 MCG/INH AEPB Inhale 1 puff into the lungs daily.   sacubitril-valsartan (ENTRESTO) 49-51 MG Take 1 tablet by mouth 2 (two) times daily.   spironolactone (ALDACTONE) 25 MG tablet Take 0.5 tablets (12.5 mg total) by mouth daily.   [DISCONTINUED] cilostazol (PLETAL) 100 MG tablet Take 1 tablet (100 mg total) by mouth 2 (two) times daily.   [DISCONTINUED] furosemide (LASIX) 40 MG tablet Take 0.5 tablets (20 mg total) by mouth daily.     Allergies:   Patient has no known allergies.   Social History   Socioeconomic History   Marital status: Divorced    Spouse name: Not on file   Number of children: Not on file   Years of education: Not on file   Highest education level: Not on file  Occupational History   Not on file  Tobacco Use   Smoking status: Former    Packs/day: 2.00    Years: 57.00  Pack years: 114.00    Types: Cigarettes    Quit date: 07/02/2020    Years since quitting: 0.4   Smokeless tobacco: Former  Building services engineer Use: Never used  Substance and Sexual Activity   Alcohol use: Yes    Comment: occassional beer   Drug use: Never   Sexual activity: Not on file  Other Topics Concern   Not on file  Social History Narrative   Not on file   Social Determinants of Health   Financial Resource Strain: Not on file  Food Insecurity: Not on file  Transportation Needs: Not on file  Physical Activity: Not on file  Stress: Not on file  Social Connections: Not on file     Family History: The patient's family history includes Diabetes in his brother and mother; Hypertension in  his brother and mother.  ROS:   Please see the history of present illness.     All other systems reviewed and are negative.  EKGs/Labs/Other Studies Reviewed:    The following studies were reviewed today:   EKG:  EKG not ordered today.   Recent Labs: 07/07/2020: Hemoglobin 16.0; Platelets 211 09/20/2020: BUN 25; Creatinine, Ser 1.34; Potassium 4.4; Sodium 137  Recent Lipid Panel    Component Value Date/Time   CHOL 199 07/13/2020 1040   TRIG 134 07/13/2020 1040   HDL 25 (L) 07/13/2020 1040   CHOLHDL 8.0 (H) 07/13/2020 1040   LDLCALC 149 (H) 07/13/2020 1040     Risk Assessment/Calculations:      Physical Exam:    VS:  BP 110/78 (BP Location: Left Arm, Patient Position: Sitting, Cuff Size: Large)   Pulse 79   Ht 5\' 11"  (1.803 m)   Wt 210 lb (95.3 kg)   SpO2 97%   BMI 29.29 kg/m     Wt Readings from Last 3 Encounters:  12/31/20 210 lb (95.3 kg)  12/02/20 210 lb 6 oz (95.4 kg)  11/22/20 211 lb (95.7 kg)     GEN:  Well nourished, well developed in no acute distress HEENT: Normal NECK: No JVD; No carotid bruits LYMPHATICS: No lymphadenopathy CARDIAC: RRR, faint systolic murmur, RESPIRATORY:  Clear to auscultation without rales, wheezing or rhonchi  ABDOMEN: Soft, non-tender, non-distended MUSCULOSKELETAL:  No edema; No deformity  SKIN: Warm and dry NEUROLOGIC:  Alert and oriented x 3 PSYCHIATRIC:  Normal affect   ASSESSMENT:    1. Coronary artery disease involving native coronary artery of native heart without angina pectoris   2. Ischemic cardiomyopathy   3. Primary hypertension   4. PAD (peripheral artery disease) (HCC)      PLAN:    In order of problems listed above:  Significant two-vessel CAD on coronary CTA.  (CTO of RCA and mid left circumflex). LAD with non obstructive disease.  Cardiac MRI 08/2020 showed severely reduced EF, 25%,inferior inferolateral walls are nonviable.  Cont aspirin, Lipitor 40 mg daily. Ischemic cardiomyopathy, EF 25% , TTE  EF 30%.  Continue Coreg 6.25 mg twice daily, heart rate 63., Entresto 49/51 twice daily.  Aldactone 12.5 mg daily. Hypertension, BP controlled.  Aldactone, Coreg, Entresto as above. PAD, significant right SFA stenosis, left anterior tibial artery stenosis.  Leg pains improved after back injection.  Continue aspirin, Lipitor.  Follow-up as needed if in the area.  He plans to move back to his home in [place] 09/2020.  Encouraged to establish care with a local cardiologist when he returns.   Medication Adjustments/Labs and Tests Ordered: Current medicines are  reviewed at length with the patient today.  Concerns regarding medicines are outlined above.  No orders of the defined types were placed in this encounter.  Meds ordered this encounter  Medications   furosemide (LASIX) 40 MG tablet    Sig: Take 0.5 tablets (20 mg total) by mouth daily as needed. As needed for swelling.    Dispense:  30 tablet    Refill:  5    NO NEED TO REFILL AT THIS TIME. Changed to prn.      Patient Instructions  Medication Instructions:   Your physician has recommended you make the following change in your medication:    STOP taking Cilostazol.  2.  Take Lasix 20 MG only as needed for swelling.    *If you need a refill on your cardiac medications before your next appointment, please call your pharmacy*   Lab Work: None ordered If you have labs (blood work) drawn today and your tests are completely normal, you will receive your results only by: MyChart Message (if you have MyChart) OR A paper copy in the mail If you have any lab test that is abnormal or we need to change your treatment, we will call you to review the results.   Testing/Procedures: None ordered   Follow-Up: At [facility], you and your health needs are our priority.  As part of our continuing mission to provide you with exceptional heart care, we have created designated Provider Care Teams.  These Care Teams include your primary  Cardiologist (physician) and Advanced Practice Providers (APPs -  Physician Assistants and Nurse Practitioners) who all work together to provide you with the care you need, when you need it.  We recommend signing up for the patient portal called "MyChart".  Sign up information is provided on this After Visit Summary.  MyChart is used to connect with patients for Virtual Visits (Telemedicine).  Patients are able to view lab/test results, encounter notes, upcoming appointments, etc.  Non-urgent messages can be sent to your provider as well.   To learn more about what you can do with MyChart, go to [email or web address].    Your next appointment:   Follow up as needed   The format for your next appointment:   In Person  Provider:   Debbe Odea, MD   Other Instructions    Signed, Debbe Odea, MD  12/31/2020 1:15 PM    [facility]

## 2020-12-31 NOTE — Procedures (Signed)
S1 Lumbosacral Transforaminal Epidural Steroid Injection - Sub-Pedicular Approach with Fluoroscopic Guidance   Patient: Luis Madden      Date of Birth: Sep 08, 1950 MRN: [id number] PCP: Pcp, No      Visit Date: 12/23/2020   Universal Protocol:    Date/Time: 09/30/225:02 PM  Consent Given By: the patient  Position:  PRONE  Additional Comments: Vital signs were monitored before and after the procedure. Patient was prepped and draped in the usual sterile fashion. The correct patient, procedure, and site was verified.   Injection Procedure Details:  Procedure Site One Meds Administered:  Meds ordered this encounter  Medications   betamethasone acetate-betamethasone sodium phosphate (CELESTONE) injection 12 mg    Laterality: Bilateral  Location/Site:  S1 Foramen   Needle size: 22 ga.  Needle type: Spinal  Needle Placement: Transforaminal  Findings:   -Comments: Excellent flow of contrast along the nerve, nerve root and into the epidural space.  Epidurogram: Contrast epidurogram showed no nerve root cut off or restricted flow pattern.  Procedure Details: After squaring off the sacral end-plate to get a true AP view, the C-arm was positioned so that the best possible view of the S1 foramen was visualized. The soft tissues overlying this structure were infiltrated with 2-3 ml. of 1% Lidocaine without Epinephrine.    The spinal needle was inserted toward the target using a "trajectory" view along the fluoroscope beam.  Under AP and lateral visualization, the needle was advanced so it did not puncture dura. Biplanar projections were used to confirm position. Aspiration was confirmed to be negative for CSF and/or blood. A 1-2 ml. volume of Isovue-250 was injected and flow of contrast was noted at each level. Radiographs were obtained for documentation purposes.   After attaining the desired flow of contrast documented above, a 0.5 to 1.0 ml test dose of 0.25% Marcaine was  injected into each respective transforaminal space.  The patient was observed for 90 seconds post injection.  After no sensory deficits were reported, and normal lower extremity motor function was noted,   the above injectate was administered so that equal amounts of the injectate were placed at each foramen (level) into the transforaminal epidural space.   Additional Comments:  The patient tolerated the procedure well Dressing: Band-Aid with 2 x 2 sterile gauze    Post-procedure details: Patient was observed during the procedure. Post-procedure instructions were reviewed.  Patient left the clinic in stable condition.

## 2021-01-14 ENCOUNTER — Ambulatory Visit: Payer: Medicare Other | Admitting: Cardiovascular Disease

## 2021-02-22 ENCOUNTER — Other Ambulatory Visit: Payer: Self-pay | Admitting: *Deleted

## 2021-02-22 MED ORDER — ATORVASTATIN CALCIUM 40 MG PO TABS: 40.0000 mg | ORAL_TABLET | Freq: Every day | ORAL | 5 refills | Status: AC

## 2021-03-17 ENCOUNTER — Other Ambulatory Visit: Payer: Self-pay | Admitting: *Deleted

## 2021-03-17 MED ORDER — CARVEDILOL 6.25 MG PO TABS: 6.2500 mg | ORAL_TABLET | Freq: Two times a day (BID) | ORAL | 0 refills | Status: AC

## 2022-09-27 IMAGING — DX DG CHEST 2V
2 series · 2 of 2 positions shown · non-contrast
Comparison: None.

CLINICAL DATA: Shortness of breath.  History of smoking.

EXAM:
CHEST - 2 VIEW

[chest pa]
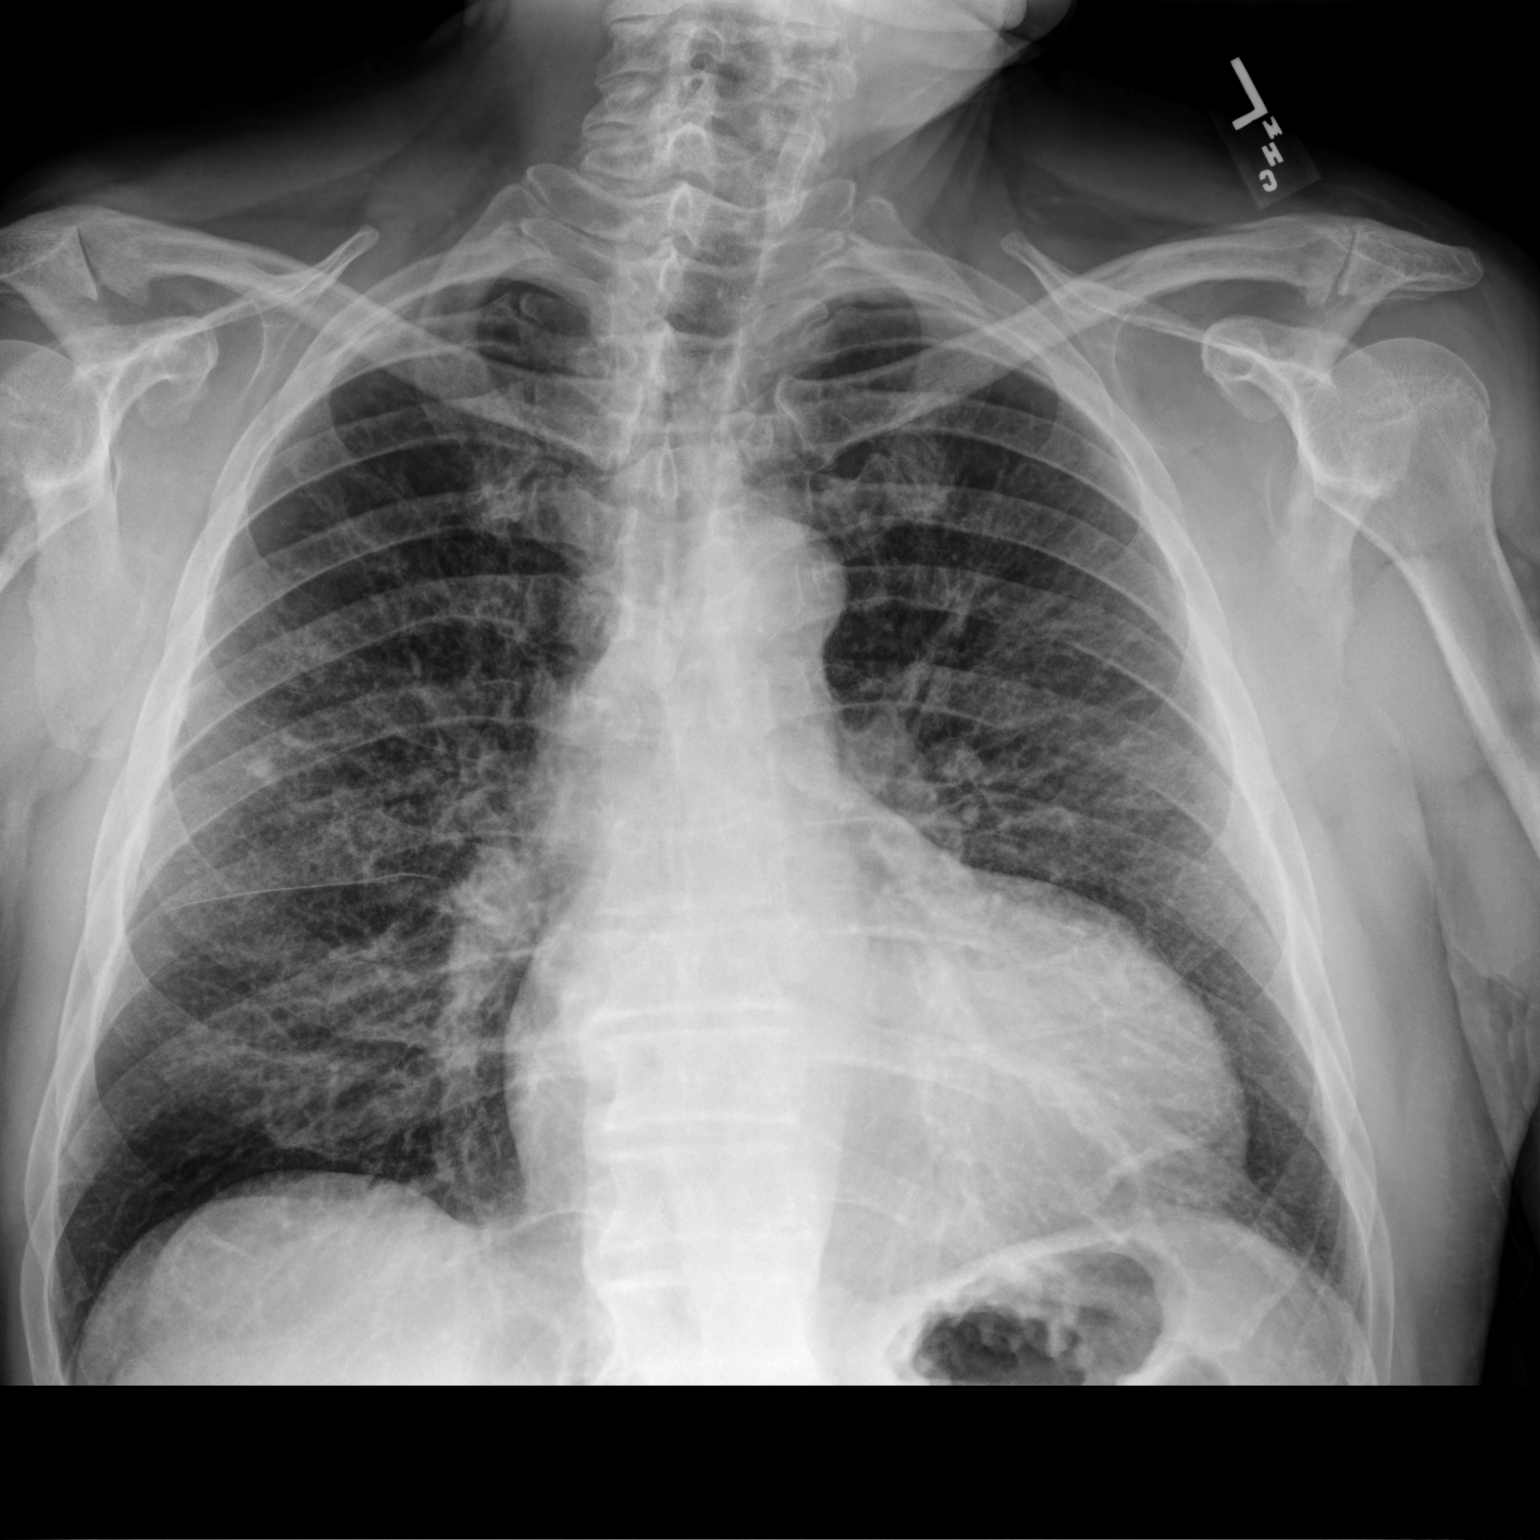

[chest lat]
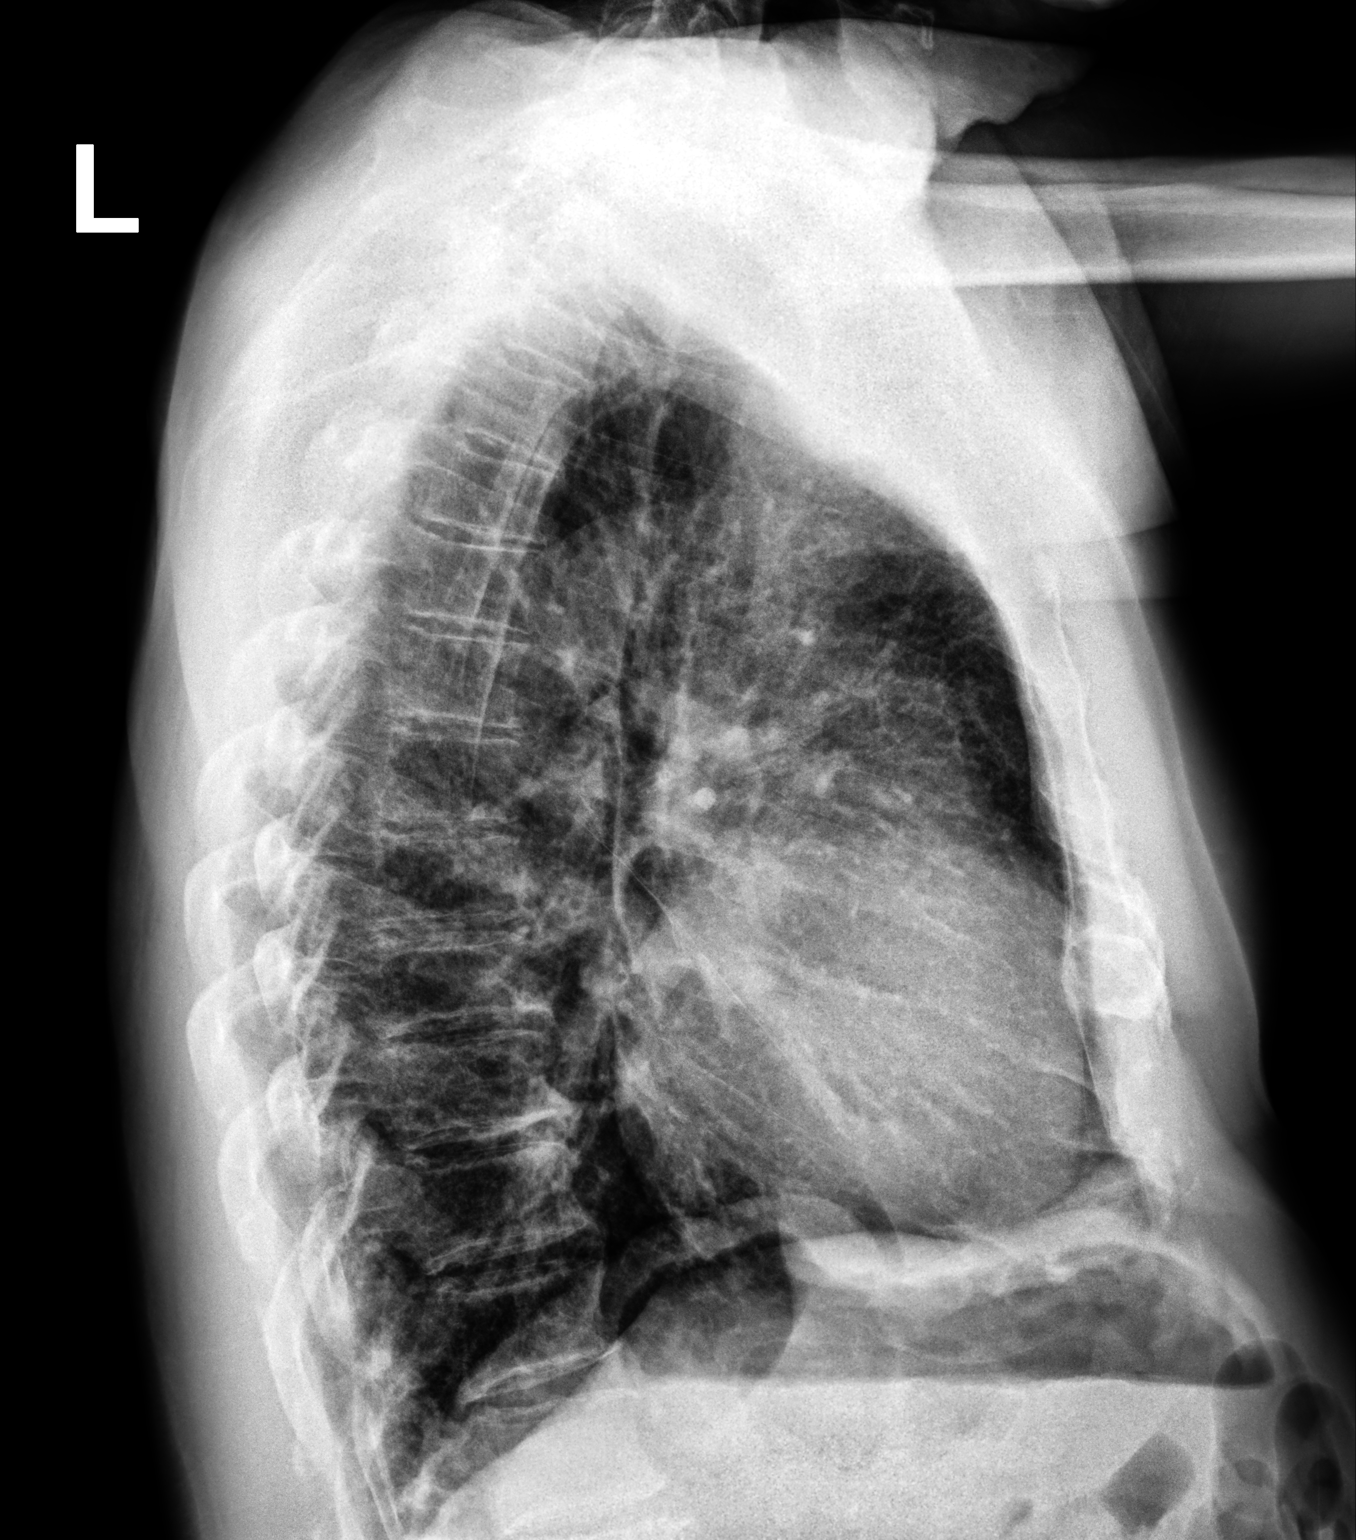

[2 of 2 positions shown; findings below may reference images not displayed]

FINDINGS: The cardiac silhouette is mildly enlarged. The lungs are mildly
hyperinflated. There is peribronchial thickening with mild diffuse
coarsening of the interstitial markings. A 6 mm nodule projects over
the right mid lung with its density suggesting a calcified
granuloma. No confluent airspace opacity, overt pulmonary edema,
pleural effusion, pneumothorax is identified. No acute osseous
abnormality is seen.
IMPRESSION: 1. Bronchitic changes.
2. Likely calcified granuloma in the right mid lung.

## 2022-10-31 IMAGING — MR MR CARD MORPHOLOGY WO/W CM
45 of 48 series · 45 of 48 positions shown · IV contrast (gadavist)
Comparison: none

CLINICAL DATA: Evaluate for viability, CCTA with CTO of RCA and LCx

EXAM:
CARDIAC MRI
TECHNIQUE: The patient was scanned on a 1.5 Tesla GE magnet. A dedicated
cardiac coil was used. Functional imaging was done using [employer]
sequences. [DATE], and 4 chamber views were done to assess for RWMA's.
Modified Cilver rule using a short axis stack was used to
calculate an ejection fraction on a dedicated work station using
[employer] software. The patient received 9 cc of Gadavist. After 10
minutes inversion recovery sequences were used to assess for
infiltration and scar tissue.
CONTRAST:  9 cc  of Gadavist

[Series 4: [id number] · axial · 8.0mm · 1.41mm/px · 1 of 18 slices shown]
[im 1/18]
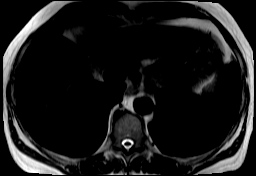

[Series 8: bSSFP · oblique · 8.0mm · 1.61mm/px · 1 of 25 slices shown (1 of 21)]
[im 1/25]
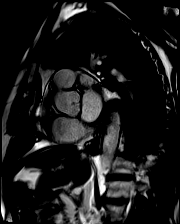

[Series 9: bSSFP · oblique · 8.0mm · 1.61mm/px · 1 of 25 slices shown (2 of 21)]
[im 1/25]
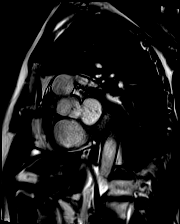

[Series 10: bSSFP · oblique · 8.0mm · 1.61mm/px · 1 of 25 slices shown (3 of 21)]
[im 1/25]
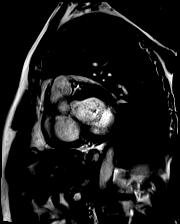

[Series 11: bSSFP · oblique · 8.0mm · 1.61mm/px · 1 of 25 slices shown (4 of 21)]
[im 1/25]
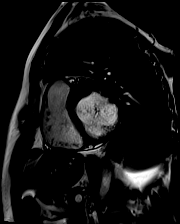

[Series 12: bSSFP · oblique · 8.0mm · 1.61mm/px · 1 of 25 slices shown (5 of 21)]
[im 1/25]
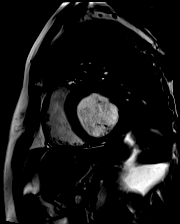

[Series 13: bSSFP · oblique · 8.0mm · 1.61mm/px · 1 of 25 slices shown (6 of 21)]
[im 1/25]
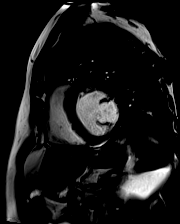

[Series 14: bSSFP · oblique · 8.0mm · 1.61mm/px · 1 of 25 slices shown (7 of 21)]
[im 1/25]
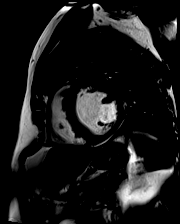

[Series 15: bSSFP · oblique · 8.0mm · 1.61mm/px · 1 of 25 slices shown (8 of 21)]
[im 1/25]
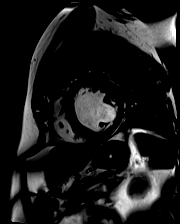

[Series 16: bSSFP · oblique · 8.0mm · 1.61mm/px · 1 of 25 slices shown (9 of 21)]
[im 1/25]
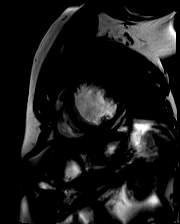

[Series 17: bSSFP · oblique · 8.0mm · 1.61mm/px · 1 of 25 slices shown (10 of 21)]
[im 1/25]
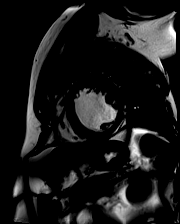

[Series 18: bSSFP · oblique · 8.0mm · 1.61mm/px · 1 of 25 slices shown (11 of 21)]
[im 1/25]
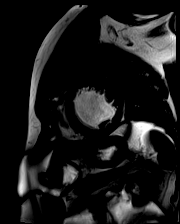

[Series 19: bSSFP · oblique · 8.0mm · 1.61mm/px · 1 of 25 slices shown (12 of 21)]
[im 1/25]
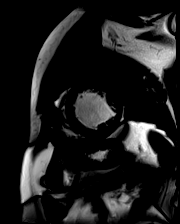

[Series 20: bSSFP · oblique · 8.0mm · 1.61mm/px · 1 of 25 slices shown (13 of 21)]
[im 1/25]
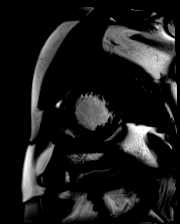

[Series 21: bSSFP · oblique · 8.0mm · 1.61mm/px · 1 of 25 slices shown (14 of 21)]
[im 1/25]
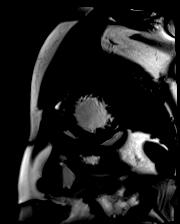

[Series 22: bSSFP · oblique · 8.0mm · 1.61mm/px · 1 of 25 slices shown (15 of 21)]
[im 1/25]
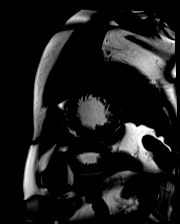

[Series 23: bSSFP · oblique · 8.0mm · 1.61mm/px · 1 of 25 slices shown (16 of 21)]
[im 1/25]
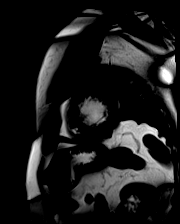

[Series 24: bSSFP · oblique · 8.0mm · 1.61mm/px · 1 of 25 slices shown (17 of 21)]
[im 1/25]
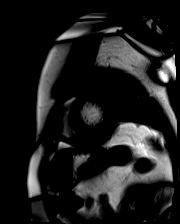

[Series 25: bSSFP · oblique · 8.0mm · 1.61mm/px · 1 of 25 slices shown (18 of 21)]
[im 1/25]
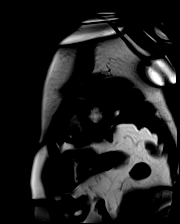

[Series 26: bSSFP · oblique · 8.0mm · 1.61mm/px · 1 of 25 slices shown (19 of 21)]
[im 1/25]
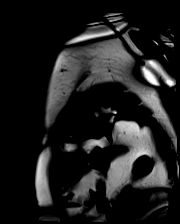

[Series 27: bSSFP · oblique · 8.0mm · 1.61mm/px · 1 of 25 slices shown (20 of 21)]
[im 1/25]
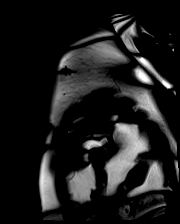

[Series 28: bSSFP · oblique · 8.0mm · 1.61mm/px · 1 of 25 slices shown (21 of 21)]
[im 1/25]
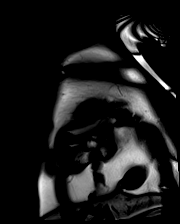

[Series 29: cine_trufi_cs_rt_short axis · sagittal · 8.0mm · 1.83mm/px · 1 of 17 slices shown (1 of 20)]
[im 1/17]
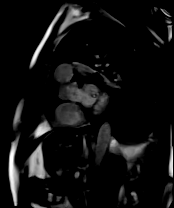

[Series 29: cine_trufi_cs_rt_short axis · sagittal · 8.0mm · 1.83mm/px · 1 of 17 slices shown (2 of 20)]
[im 1/17]
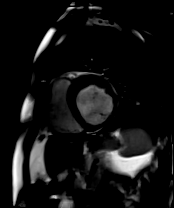

[Series 29: cine_trufi_cs_rt_short axis · sagittal · 8.0mm · 1.83mm/px · 1 of 17 slices shown (3 of 20)]
[im 1/17]
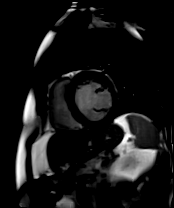

[Series 29: cine_trufi_cs_rt_short axis · sagittal · 8.0mm · 1.83mm/px · 1 of 17 slices shown (4 of 20)]
[im 1/17]
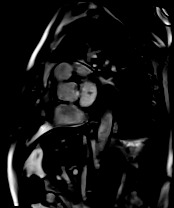

[Series 29: cine_trufi_cs_rt_short axis · sagittal · 8.0mm · 1.83mm/px · 1 of 17 slices shown (5 of 20)]
[im 1/17]
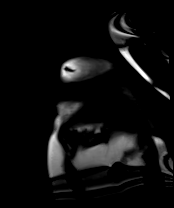

[Series 29: cine_trufi_cs_rt_short axis · sagittal · 8.0mm · 1.83mm/px · 1 of 17 slices shown (6 of 20)]
[im 1/17]
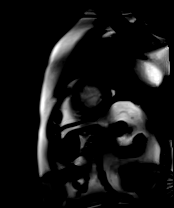

[Series 29: cine_trufi_cs_rt_short axis · sagittal · 8.0mm · 1.83mm/px · 1 of 17 slices shown (7 of 20)]
[im 1/17]
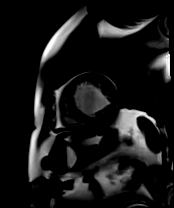

[Series 29: cine_trufi_cs_rt_short axis · sagittal · 8.0mm · 1.83mm/px · 1 of 17 slices shown (8 of 20)]
[im 1/17]
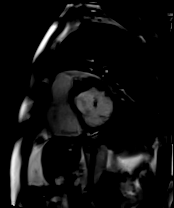

[Series 29: cine_trufi_cs_rt_short axis · sagittal · 8.0mm · 1.83mm/px · 1 of 17 slices shown (9 of 20)]
[im 1/17]
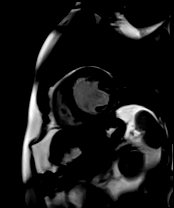

[Series 29: cine_trufi_cs_rt_short axis · sagittal · 8.0mm · 1.83mm/px · 1 of 17 slices shown (10 of 20)]
[im 1/17]
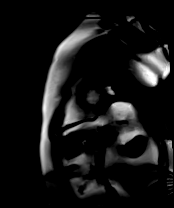

[Series 29: cine_trufi_cs_rt_short axis · sagittal · 8.0mm · 1.83mm/px · 1 of 17 slices shown (11 of 20)]
[im 1/17]
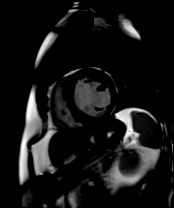

[Series 29: cine_trufi_cs_rt_short axis · sagittal · 8.0mm · 1.83mm/px · 1 of 17 slices shown (12 of 20)]
[im 1/17]
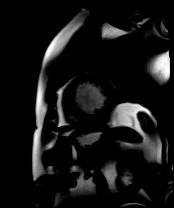

[Series 29: cine_trufi_cs_rt_short axis · sagittal · 8.0mm · 1.83mm/px · 1 of 17 slices shown (13 of 20)]
[im 1/17]
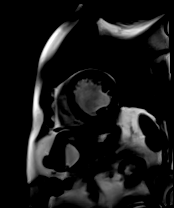

[Series 29: cine_trufi_cs_rt_short axis · sagittal · 8.0mm · 1.83mm/px · 1 of 17 slices shown (14 of 20)]
[im 1/17]
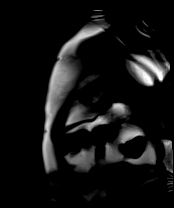

[Series 29: cine_trufi_cs_rt_short axis · sagittal · 8.0mm · 1.83mm/px · 1 of 17 slices shown (15 of 20)]
[im 1/17]
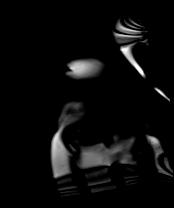

[Series 29: cine_trufi_cs_rt_short axis · sagittal · 8.0mm · 1.83mm/px · 1 of 17 slices shown (16 of 20)]
[im 1/17]
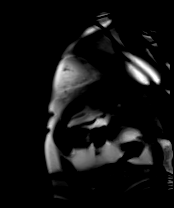

[Series 29: cine_trufi_cs_rt_short axis · sagittal · 8.0mm · 1.83mm/px · 1 of 17 slices shown (17 of 20)]
[im 1/17]
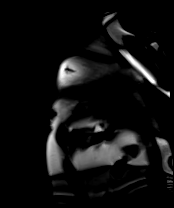

[Series 29: cine_trufi_cs_rt_short axis · sagittal · 8.0mm · 1.83mm/px · 1 of 17 slices shown (18 of 20)]
[im 1/17]
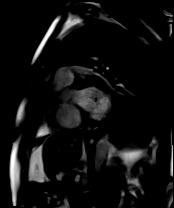

[Series 29: cine_trufi_cs_rt_short axis · sagittal · 8.0mm · 1.83mm/px · 1 of 17 slices shown (19 of 20)]
[im 1/17]
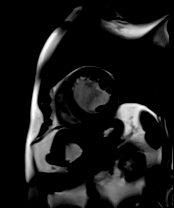

[Series 29: cine_trufi_cs_rt_short axis · sagittal · 8.0mm · 1.83mm/px · 1 of 17 slices shown (20 of 20)]
[im 1/17]
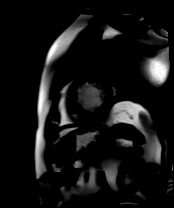

[Series 30: 3ch stack · oblique · 6.0mm · 1.41mm/px · 1 of 25 slices shown (1 of 3)]
[im 1/25]
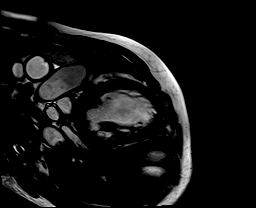

[Series 31: 3ch stack · oblique · 6.0mm · 1.41mm/px · 1 of 25 slices shown (2 of 3)]
[im 1/25]
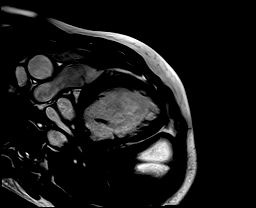

[Series 32: 3ch stack · oblique · 6.0mm · 1.41mm/px · 1 of 25 slices shown (3 of 3)]
[im 1/25]
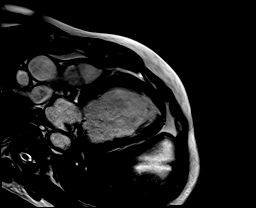

[45 of 48 positions shown; findings below may reference images not displayed]

FINDINGS: 1. Moderately dilated left ventricle, normal thickness, Severely
reduced LV systolic function, (LVEF = 25%). There is severe
hypokinesis of the inferior and inferolateral LV walls.

There is transmural (75-100%) late gadolinium enhancement involving
inferior and inferolateral walls of the LV.

LVEDV: 374 ml

LVESV: 280 ml

SV: 94 ml

CO: 6L/min

Myocardial mass: 251g

2. Normal right ventricular size, thickness. Mild to moderately
reduced RV systolic function (RVEF = 40%). There is global
hypokinesis of the RV.

3.  Mildly dilated left atrium, normal RA size.

4. Normal size of the aortic root, ascending aorta and pulmonary
artery.

5.  Mild mitral regurgitation, mild aortic regurgitation.

6.  Normal pericardium.  No pericardial effusion.
IMPRESSION: 1.  Severely reduced LV systolic function, LVEF 25%.

2. There is thinning and severe hypokinesis of the inferior and
inferolateral walls.

3. There is transmural late gadolinium enhancement (scar) involving
the LV inferior and inferolateral walls.

4.  Findings consistent with ischemic cardiomyopathy.

5.  Inferior and inferolateral walls of the LV are not viable.

Naabi Isebaert
